# Patient Record
Sex: Female | Born: 1980 | Race: White | Hispanic: No | Marital: Married | Smoking: Former smoker
Health system: Southern US, Community
[De-identification: ages and names within clinical notes are randomized; demographics above are authoritative.]

## PROBLEM LIST (undated history)

## (undated) DIAGNOSIS — E669 Obesity, unspecified: Secondary | ICD-10-CM

## (undated) DIAGNOSIS — F419 Anxiety disorder, unspecified: Secondary | ICD-10-CM

## (undated) DIAGNOSIS — N809 Endometriosis, unspecified: Secondary | ICD-10-CM

## (undated) DIAGNOSIS — F32A Depression, unspecified: Secondary | ICD-10-CM

## (undated) DIAGNOSIS — E079 Disorder of thyroid, unspecified: Secondary | ICD-10-CM

## (undated) DIAGNOSIS — R51 Headache: Secondary | ICD-10-CM

## (undated) DIAGNOSIS — R519 Headache, unspecified: Secondary | ICD-10-CM

## (undated) DIAGNOSIS — N979 Female infertility, unspecified: Secondary | ICD-10-CM

## (undated) DIAGNOSIS — E039 Hypothyroidism, unspecified: Secondary | ICD-10-CM

## (undated) DIAGNOSIS — K529 Noninfective gastroenteritis and colitis, unspecified: Secondary | ICD-10-CM

## (undated) DIAGNOSIS — F329 Major depressive disorder, single episode, unspecified: Secondary | ICD-10-CM

## (undated) HISTORY — DX: Female infertility, unspecified: N97.9

## (undated) HISTORY — DX: Endometriosis, unspecified: N80.9

## (undated) HISTORY — DX: Major depressive disorder, single episode, unspecified: F32.9

## (undated) HISTORY — DX: Depression, unspecified: F32.A

## (undated) HISTORY — PX: TONSILLECTOMY: SUR1361

## (undated) HISTORY — DX: Disorder of thyroid, unspecified: E07.9

## (undated) HISTORY — PX: OTHER SURGICAL HISTORY: SHX169

## (undated) HISTORY — PX: COLON SURGERY: SHX602

## (undated) HISTORY — DX: Headache: R51

## (undated) HISTORY — PX: MASS EXCISION: SHX2000

## (undated) HISTORY — DX: Obesity, unspecified: E66.9

## (undated) HISTORY — DX: Headache, unspecified: R51.9

## (undated) HISTORY — DX: Anxiety disorder, unspecified: F41.9

## (undated) HISTORY — DX: Noninfective gastroenteritis and colitis, unspecified: K52.9

---

## 2000-06-12 ENCOUNTER — Encounter: Admission: RE | Admit: 2000-06-12 | Discharge: 2000-06-12 | Payer: Self-pay

## 2001-12-26 ENCOUNTER — Other Ambulatory Visit: Admission: RE | Admit: 2001-12-26 | Discharge: 2001-12-26 | Payer: Self-pay | Admitting: Family Medicine

## 2008-03-11 ENCOUNTER — Ambulatory Visit: Payer: Self-pay | Admitting: Unknown Physician Specialty

## 2008-03-11 LAB — HM COLONOSCOPY

## 2008-08-17 ENCOUNTER — Ambulatory Visit: Payer: Self-pay | Admitting: Family

## 2008-08-17 ENCOUNTER — Inpatient Hospital Stay: Payer: Self-pay | Admitting: Unknown Physician Specialty

## 2010-03-30 ENCOUNTER — Ambulatory Visit: Payer: Self-pay | Admitting: Gastroenterology

## 2010-04-29 ENCOUNTER — Other Ambulatory Visit: Payer: Self-pay | Admitting: Family

## 2010-05-23 HISTORY — PX: LEFT COLECTOMY: SHX856

## 2010-11-27 DIAGNOSIS — E079 Disorder of thyroid, unspecified: Secondary | ICD-10-CM

## 2010-11-27 HISTORY — DX: Disorder of thyroid, unspecified: E07.9

## 2011-05-23 ENCOUNTER — Ambulatory Visit: Payer: Self-pay | Admitting: Gastroenterology

## 2011-10-26 ENCOUNTER — Ambulatory Visit: Payer: Self-pay

## 2012-11-28 ENCOUNTER — Ambulatory Visit: Payer: Self-pay | Admitting: Family Medicine

## 2013-09-17 ENCOUNTER — Ambulatory Visit: Payer: Self-pay | Admitting: Gastroenterology

## 2015-03-12 DIAGNOSIS — N800 Endometriosis of uterus: Secondary | ICD-10-CM

## 2015-03-12 DIAGNOSIS — N8003 Adenomyosis of the uterus: Secondary | ICD-10-CM

## 2015-03-12 HISTORY — DX: Adenomyosis of the uterus: N80.03

## 2015-03-12 HISTORY — DX: Endometriosis of uterus: N80.0

## 2015-05-07 ENCOUNTER — Other Ambulatory Visit: Payer: Self-pay | Admitting: Family Medicine

## 2015-05-11 ENCOUNTER — Other Ambulatory Visit: Payer: Self-pay | Admitting: Family Medicine

## 2015-05-18 ENCOUNTER — Telehealth: Payer: Self-pay | Admitting: Family Medicine

## 2015-05-18 NOTE — Telephone Encounter (Signed)
Pt has not been since 2014. She is going out of town. Please review. Thank you-aa

## 2015-05-18 NOTE — Telephone Encounter (Signed)
Pt contacted office for refill request on the following medications:  Levothyroxine Sodium 125MCG.  [place].  Pt states she is leaving tomorrow to go out of town and is requesting this sent to the pharmacy today if possible.  CB#[phone]/MJ

## 2015-05-18 NOTE — Telephone Encounter (Signed)
  She can have 15 pills. Doing that against my better judgement. Must make appt to be see.  Tawanna Sat would be most appropriate.

## 2015-05-18 NOTE — Telephone Encounter (Signed)
Prescription for # 15 pills was called to [facility] and they were asked to tell the patient she could not get any more without being seen.  She was a patient of Debbie's and she can make an appointment with Tawanna Sat

## 2015-06-22 DIAGNOSIS — E669 Obesity, unspecified: Secondary | ICD-10-CM | POA: Insufficient documentation

## 2015-06-22 DIAGNOSIS — N920 Excessive and frequent menstruation with regular cycle: Secondary | ICD-10-CM | POA: Insufficient documentation

## 2015-06-22 DIAGNOSIS — F419 Anxiety disorder, unspecified: Secondary | ICD-10-CM | POA: Insufficient documentation

## 2015-06-22 DIAGNOSIS — F329 Major depressive disorder, single episode, unspecified: Secondary | ICD-10-CM | POA: Insufficient documentation

## 2015-06-22 DIAGNOSIS — E039 Hypothyroidism, unspecified: Secondary | ICD-10-CM | POA: Insufficient documentation

## 2015-06-23 ENCOUNTER — Ambulatory Visit (INDEPENDENT_AMBULATORY_CARE_PROVIDER_SITE_OTHER): Payer: BLUE CROSS/BLUE SHIELD | Admitting: Family Medicine

## 2015-06-23 ENCOUNTER — Encounter: Payer: Self-pay | Admitting: Family Medicine

## 2015-06-23 VITALS — BP 128/64 | HR 84 | Temp 99.1°F | Resp 16 | Ht 66.0 in | Wt 260.0 lb

## 2015-06-23 DIAGNOSIS — E038 Other specified hypothyroidism: Secondary | ICD-10-CM | POA: Diagnosis not present

## 2015-06-23 DIAGNOSIS — E669 Obesity, unspecified: Secondary | ICD-10-CM

## 2015-06-23 DIAGNOSIS — F419 Anxiety disorder, unspecified: Secondary | ICD-10-CM | POA: Diagnosis not present

## 2015-06-23 MED ORDER — LEVOTHYROXINE SODIUM 125 MCG PO TABS: 125.0000 ug | ORAL_TABLET | Freq: Every day | ORAL | Status: DC

## 2015-06-23 NOTE — Progress Notes (Signed)
Subjective:  HPI   Hypothyroid, follow-up:  No results found for: TSH Wt Readings from Last 3 Encounters:  06/23/15 260 lb (117.935 kg)  11/25/13 254 lb (115.214 kg)    She was last seen for hypothyroid 12 months ago. Per patient can not find the actual OV note. Management changes since that visit include none per patient. She reports good compliance with treatment. She is not having side effects.  She is experiencing fatigue due to been off her medication for about 1 1/2 months She denies palpitations Weight trend: stable      Prior to Admission medications   Medication Sig Start Date End Date Taking? Authorizing Provider  ALPRAZolam Duanne Moron) 0.5 MG tablet Take by mouth. 11/25/13  Yes Historical Provider, MD  Multiple Vitamins-Minerals (MULTIVITAMIN ADULT PO) Take 1 tablet by mouth daily.   Yes Historical Provider, MD  levothyroxine (SYNTHROID, LEVOTHROID) 125 MCG tablet Take by mouth. 12/08/14   Historical Provider, MD    Patient Active Problem List   Diagnosis Date Noted  . Anxiety 06/22/2015  . 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 Adult hypothyroidism 06/22/2015  . Irregular bleeding 06/22/2015  . Adiposity 06/22/2015    No past medical history on file.  History   Social History  . Marital Status: Married    Spouse Name: Tige  . Number of Children: 0  . Years of Education: 16   Occupational History  . [employer]    Social History Main Topics  . Smoking status: Former Smoker -- 0.25 packs/day for 8 years    Types: Cigarettes  . Smokeless tobacco: Never Used  . Alcohol Use: No  . Drug Use: No  . Sexual Activity: Yes    Birth Control/ Protection: None   Other Topics Concern  . Not on file   Social History Narrative    Allergies  Allergen Reactions  . Cortisporin  [Neomycin-Polymyxin-Hc] Swelling    ROS  Immunization History  Administered Date(s) Administered  . Hepatitis B 01/08/1997, 06/16/1997  . Td 01/08/1997,  05/19/1999   Objective:  BP 128/64 mmHg  Pulse 84  Temp(Src) 99.1 F (37.3 C)  Resp 16  Ht 5\' 6"  (1.676 m)  Wt 260 lb (117.935 kg)  BMI 41.99 kg/m2  LMP 06/13/2015 (Exact Date)  Physical Exam  Constitutional: She is oriented to person, place, and time and well-developed, well-nourished, and in no distress.  HENT:  Head: Normocephalic.  Eyes: Conjunctivae are normal. Pupils are equal, round, and reactive to light.  Neck: Normal range of motion. Neck supple.  Cardiovascular: Normal rate, regular rhythm, normal heart sounds and intact distal pulses.   No murmur heard. Pulmonary/Chest: Effort normal and breath sounds normal. No respiratory distress. She has no wheezes.  Musculoskeletal: Normal range of motion. She exhibits no edema or tenderness.  Neurological: She is alert and oriented to person, place, and time. Gait normal.  Psychiatric: Mood, memory, affect and judgment normal.    No results found for: WBC, HGB, HCT, PLT, GLUCOSE, CHOL, TRIG, HDL, LDLDIRECT, LDLCALC, TSH, PSA, INR, GLUF, HGBA1C, MICROALBUR  CMP  No results found for: NA, K, CL, CO2, GLUCOSE, BUN, CREATININE, CALCIUM, PROT, ALBUMIN, AST, ALT, ALKPHOS, BILITOT, GFRNONAA, GFRAA  Assessment and Plan :  1. Other specified hypothyroidism Patient has been off the medication for about 1 month. Will check levels today and refill medication, advised patient levels will be off but will continue at this time.  - TSH  2. Adiposity   3. Anxiety Stable per  patient     Miguel Aschoff MD [facility] 06/23/2015 8:42 AM

## 2015-06-24 LAB — TSH: TSH: 2.72 u[IU]/mL (ref 0.450–4.500)

## 2015-06-29 ENCOUNTER — Ambulatory Visit: Payer: Self-pay | Admitting: Family Medicine

## 2015-11-18 ENCOUNTER — Encounter: Payer: BLUE CROSS/BLUE SHIELD | Admitting: Family Medicine

## 2016-06-28 ENCOUNTER — Other Ambulatory Visit: Payer: Self-pay | Admitting: Family Medicine

## 2016-06-28 DIAGNOSIS — E038 Other specified hypothyroidism: Secondary | ICD-10-CM

## 2017-01-02 ENCOUNTER — Ambulatory Visit (INDEPENDENT_AMBULATORY_CARE_PROVIDER_SITE_OTHER): Payer: BLUE CROSS/BLUE SHIELD | Admitting: Family Medicine

## 2017-01-02 VITALS — BP 112/74 | HR 74 | Temp 98.0°F | Resp 18 | Wt 253.0 lb

## 2017-01-02 DIAGNOSIS — R52 Pain, unspecified: Secondary | ICD-10-CM | POA: Diagnosis not present

## 2017-01-02 DIAGNOSIS — R509 Fever, unspecified: Secondary | ICD-10-CM

## 2017-01-02 DIAGNOSIS — J320 Chronic maxillary sinusitis: Secondary | ICD-10-CM | POA: Diagnosis not present

## 2017-01-02 DIAGNOSIS — J029 Acute pharyngitis, unspecified: Secondary | ICD-10-CM | POA: Diagnosis not present

## 2017-01-02 LAB — POCT INFLUENZA A/B
INFLUENZA A, POC: NEGATIVE
INFLUENZA B, POC: NEGATIVE

## 2017-01-02 LAB — POCT RAPID STREP A (OFFICE): RAPID STREP A SCREEN: NEGATIVE

## 2017-01-02 MED ORDER — AMOXICILLIN-POT CLAVULANATE 875-125 MG PO TABS: 1.0000 | ORAL_TABLET | Freq: Two times a day (BID) | ORAL | 0 refills | Status: DC

## 2017-01-02 NOTE — Progress Notes (Signed)
Jodi Carpenter  MRN: [id number] DOB: 1981/09/25  Subjective:  HPI   The patient is a 36 year old female who presents with cold symptoms.  They began 5 days ago.  She has had very sore throat, productive cough of yellow/green sputum, right ear stopped up, low grade fever and some body aches.  She states that the throat and ear are the worst of the symptoms.  She has used Tylenol and some NyQuil.    Patient Active Problem List   Diagnosis Date Noted  . Anxiety 06/22/2015  . 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 Adult hypothyroidism 06/22/2015  . Irregular bleeding 06/22/2015  . Adiposity 06/22/2015    No past medical history on file.  Social History   Social History  . Marital status: Married    Spouse name: Jodi Carpenter  . Number of children: 0  . Years of education: 48   Occupational History  . [employer]    Social History Main Topics  . Smoking status: Former Smoker    Packs/day: 0.25    Years: 8.00    Types: Cigarettes  . Smokeless tobacco: Never Used  . Alcohol use No  . Drug use: No  . Sexual activity: Yes    Birth control/ protection: None   Other Topics Concern  . Not on file   Social History Narrative  . No narrative on file    Outpatient Encounter Prescriptions as of 01/02/2017  Medication Sig Note  . ALPRAZolam (XANAX) 0.5 MG tablet Take by mouth. 06/22/2015: Medication taken as needed.  Received from: [facility]  . levothyroxine (SYNTHROID, LEVOTHROID) 125 MCG tablet TAKE 1 TABLET EVERY DAY   . Multiple Vitamins-Minerals (MULTIVITAMIN ADULT PO) Take 1 tablet by mouth daily.    No facility-administered encounter medications on file as of 01/02/2017.     Allergies  Allergen Reactions  . Cortisporin  [Neomycin-Polymyxin-Hc] Swelling    Review of Systems  Constitutional: Positive for chills, fever and malaise/fatigue.  HENT: Positive for congestion, ear pain (fullness), sinus pain and sore throat. Negative for ear  discharge, nosebleeds and tinnitus.   Respiratory: Positive for cough, sputum production and wheezing. Negative for hemoptysis, shortness of breath and stridor.   Cardiovascular: Negative for chest pain, palpitations and leg swelling.  Gastrointestinal: Negative for abdominal pain, blood in stool, constipation, diarrhea, nausea and vomiting.  Neurological: Positive for weakness. Negative for dizziness and headaches.    Objective:  BP 112/74 (BP Location: Right Arm, Patient Position: Sitting, Cuff Size: Large)   Pulse 74   Temp 98 F (36.7 C) (Oral)   Resp 18   Wt 253 lb (114.8 kg)   SpO2 98%   BMI 40.84 kg/m   Physical Exam  Constitutional: She is well-developed, well-nourished, and in no distress.  HENT:  Head: Normocephalic and atraumatic.  Mouth/Throat: No oropharyngeal exudate.  Eyes: Conjunctivae are normal. Pupils are equal, round, and reactive to light.  Neck: Normal range of motion. Neck supple.  Cardiovascular: Normal rate, regular rhythm and normal heart sounds.   Pulmonary/Chest: Effort normal and breath sounds normal.    Assessment and Plan :    1. Fever, unspecified fever cause  - POCT Influenza A/B - POCT rapid strep A - amoxicillin-clavulanate (AUGMENTIN) 875-125 MG tablet; Take 1 tablet by mouth 2 (two) times daily.  Dispense: 20 tablet; Refill: 0  2. Sore throat  - POCT Influenza A/B--negative - POCT rapid strep A--negative - amoxicillin-clavulanate (AUGMENTIN) 875-125 MG tablet; Take 1 tablet by mouth  2 (two) times daily.  Dispense: 20 tablet; Refill: 0  3. Body aches  - POCT Influenza A/B - POCT rapid strep A  4. Maxillary sinusitis, unspecified chronicity  - amoxicillin-clavulanate (AUGMENTIN) 875-125 MG tablet; Take 1 tablet by mouth 2 (two) times daily.  Dispense: 20 tablet; Refill: 0   HPI, Exam and A&P Transcribed under the direction and in the presence of Wilhemena Durie., MD. Electronically Signed: Althea Charon, RMA I have done the  exam and reviewed the chart and it is accurate to the best of my knowledge. Development worker, community has been used and  any errors in dictation or transcription are unintentional. Miguel Aschoff M.D. [facility]

## 2017-04-24 ENCOUNTER — Other Ambulatory Visit: Payer: Self-pay | Admitting: Family Medicine

## 2017-04-24 ENCOUNTER — Ambulatory Visit (INDEPENDENT_AMBULATORY_CARE_PROVIDER_SITE_OTHER): Payer: BLUE CROSS/BLUE SHIELD | Admitting: Certified Nurse Midwife

## 2017-04-24 ENCOUNTER — Encounter: Payer: Self-pay | Admitting: Certified Nurse Midwife

## 2017-04-24 VITALS — BP 108/68 | HR 77 | Ht 65.0 in | Wt 240.0 lb

## 2017-04-24 DIAGNOSIS — N920 Excessive and frequent menstruation with regular cycle: Secondary | ICD-10-CM

## 2017-04-24 DIAGNOSIS — Z124 Encounter for screening for malignant neoplasm of cervix: Secondary | ICD-10-CM | POA: Diagnosis not present

## 2017-04-24 DIAGNOSIS — E038 Other specified hypothyroidism: Secondary | ICD-10-CM

## 2017-04-24 DIAGNOSIS — E063 Autoimmune thyroiditis: Secondary | ICD-10-CM | POA: Insufficient documentation

## 2017-04-24 DIAGNOSIS — Z01419 Encounter for gynecological examination (general) (routine) without abnormal findings: Secondary | ICD-10-CM

## 2017-04-24 DIAGNOSIS — K5190 Ulcerative colitis, unspecified, without complications: Secondary | ICD-10-CM | POA: Insufficient documentation

## 2017-04-24 LAB — HM PAP SMEAR

## 2017-04-24 MED ORDER — TRANEXAMIC ACID 650 MG PO TABS: 1300.0000 mg | ORAL_TABLET | Freq: Three times a day (TID) | ORAL | 2 refills | Status: DC

## 2017-04-24 NOTE — Progress Notes (Signed)
Gynecology Annual Exam  PCP: Jerrol Banana., MD  Chief Complaint:  Chief Complaint  Patient presents with  . Gynecologic Exam    History of Present Illness:Jodi Carpenter presents today for her annual exam. She is a 36 year old Caucasian/White female, G0 P0000, whose LMP was 03/26/2017. She is having problems with menorrhagia. Her menses are regular. They occur every month, they last 6-7 days, are heavy flow x 4-5 days requiring pad and tampon change every hour or so. She has dysmenorrhea with the start of the menses and it lasts for 4-5 days. She takes Tylenol but it is not usually effective.  The patient's past medical history is remarkable for ulcerative colitis, Hashimoto's thyroiditis/ hypothyroidism, obesity, and infertility. She has had an extensive infertility work up. She is currently separated from her husband and is not wishing to conceive at this time.. Since her last annual GYN exam 02/16/2015,  she has lost 14#.  She is sexually active and reports no problems with intercourse. She is currently uses nothing for contraception. Her last ultrasound in 2016 was c/w adenomyosis  Her most recent Pap smear was 02/16/2015 and was NIL/neg HRHPV . No history of abnormal Pap smears  Mammogram is not applicable. There is no family history of breast cancer The patient does not do monthly self breast exams.  The patient does not smoke The patient does drink 1-2 drinks/month. The patient does not use drugs The patient does exercise regularly. She lifts weights, has trainer, runs, and walks 3-5 times a week.  Patient reports no symptoms of concern as it pertains to depression or anxiety.       Review of Systems: Review of Systems  Constitutional: Negative for chills, fever and weight loss.  HENT: Negative for congestion, sinus pain and sore throat.   Eyes: Negative for blurred vision and pain.  Respiratory: Negative for hemoptysis, shortness of breath and wheezing.     Cardiovascular: Negative for chest pain, palpitations and leg swelling.  Gastrointestinal: Positive for diarrhea (after sigmoid colectomy). Negative for abdominal pain, blood in stool, heartburn, nausea and vomiting.  Genitourinary: Negative for dysuria, frequency, hematuria and urgency.       Positive for menorrhagia and dysmenorrhea  Musculoskeletal: Negative for back pain, joint pain and myalgias.  Skin: Negative for itching and rash.  Neurological: Negative for dizziness, tingling and headaches.  Endo/Heme/Allergies: Negative for environmental allergies and polydipsia. Does not bruise/bleed easily.       Negative for hirsutism   Psychiatric/Behavioral: Negative for depression. The patient is not nervous/anxious and does not have insomnia.     Past Medical History:  Past Medical History:  Diagnosis Date  . Adenomyosis 03/12/2015  . Anxiety   . Colitis   . Depression   . Headache   . Infertility, female   . Obesity (BMI 35.0-39.9 without comorbidity)   . Thyroid disease 2012   Hashimoto's thyroiditis/ hypothyroidism    Past Surgical History:  Past Surgical History:  Procedure Laterality Date  . LEFT COLECTOMY     sigmoid colectomy with J pouch  . MASS EXCISION     pilonidal cyst and abscess  . OTHER SURGICAL HISTORY     illeionidal anal stenosis   . TONSILLECTOMY      Family History:  Family History  Problem Relation Age of Onset  . Hypertension Mother   . Panic disorder Mother   . Hypertension Father   . Diverticulosis Father   . Prostate cancer Maternal  Grandfather     Social History:  Social History   Social History  . Marital status: Legally Separated    Spouse name: Tige  . Number of children: 0  . Years of education: 17   Occupational History  . [employer]    Social History Main Topics  . Smoking status: Former Smoker    Packs/day: 0.25    Years: 8.00    Types: Cigarettes  . Smokeless tobacco: Never Used  . Alcohol use Yes      Comment: 1-2 a month  . Drug use: No  . Sexual activity: Yes    Birth control/ protection: None   Other Topics Concern  . Not on file   Social History Narrative  . No narrative on file    Allergies:  Allergies  Allergen Reactions  . Cortisporin  [Neomycin-Polymyxin-Hc] Swelling    Medications: Prior to Admission medications   Medication Sig Start Date End Date Taking? Authorizing Provider  ALPRAZolam Duanne Moron) 0.5 MG tablet Take by mouth. 11/25/13  Yes [provider]  levothyroxine (SYNTHROID, LEVOTHROID) 125 MCG tablet TAKE 1 TABLET EVERY DAY 06/28/16  Yes Jerrol Banana., MD  Multiple Vitamins-Minerals (MULTIVITAMIN ADULT PO) Take 1 tablet by mouth daily.   Yes [provider]           Physical Exam Vitals: Blood pressure 108/68, pulse 77, height 5\' 5"  (1.651 m), weight 240 lb (108.9 kg), BMI 39.94 kg/m2, last menstrual period 03/26/2017.  General: pleasant WF in NAD HEENT: normocephalic, anicteric Neck: no thyroid enlargement, no palpable nodules, no cervical lymphadenopathy  Pulmonary: No increased work of breathing, CTAB Cardiovascular: RRR, without murmur  Breast: Breast symmetrical, no tenderness, no palpable nodules or masses, no skin or nipple retraction present, no nipple discharge.  No axillary, infraclavicular or supraclavicular lymphadenopathy. Abdomen: Soft, non-tender, non-distended.  Umbilicus without lesions.  No hepatomegaly or masses palpable. No evidence of hernia. Genitourinary:  External: Normal external female genitalia.  Normal urethral meatus, normal  Bartholin's and Skene's glands.    Vagina: Normal vaginal mucosa, no evidence of prolapse.    Cervix: Grossly normal in appearance, no bleeding, non-tender  Uterus: Anteverted, globular, 8 week size, mobile, and non-tender  Adnexa: No adnexal masses, non-tender  Rectal: deferred  Lymphatic: no evidence of inguinal lymphadenopathy Extremities: no edema, erythema, or  tenderness Neurologic: Grossly intact Psychiatric: mood appropriate, affect full     Assessment: 36 y.o. G0P0000 well woman exam Menorrhagia/ suspected adenomyosis Primary infertility  Plan:    1) Breast cancer screening - recommend monthly self breast exam. Recommend starting annual mammograms at age 13 2) Cervical cancer screening - Pap was done. ASCCP guidelines and rational discussed.  Patient opts for yearly screening interval 3) Contraception - history of infertility, not using any contraception. Recommend remaining on multivitamin 4) Routine healthcare maintenance including cholesterol and diabetes screening managed by PCP. Needs appointment soon. Recommend also getting CBC. 5) Discussed treatments for menorrhagia including Lysteda, hormones (pills, depo, etc), Mirena IUD, ablation, hysterectomy. Patient desires to try Lysteda. Rx for Lysteda 650 mgm-take two tabs tid during heavy flow days for no more than 5 days. #30/RFx2 6) FU in 1 year for annual or sooner prn.  Dalia Heading, CNM

## 2017-04-27 LAB — IGP, APTIMA HPV
HPV Aptima: NEGATIVE
PAP SMEAR COMMENT: 0

## 2017-05-08 ENCOUNTER — Ambulatory Visit (INDEPENDENT_AMBULATORY_CARE_PROVIDER_SITE_OTHER): Payer: BLUE CROSS/BLUE SHIELD | Admitting: Family Medicine

## 2017-05-08 ENCOUNTER — Encounter: Payer: Self-pay | Admitting: Family Medicine

## 2017-05-08 DIAGNOSIS — E038 Other specified hypothyroidism: Secondary | ICD-10-CM

## 2017-05-08 DIAGNOSIS — Z0000 Encounter for general adult medical examination without abnormal findings: Secondary | ICD-10-CM

## 2017-05-08 MED ORDER — LEVOTHYROXINE SODIUM 125 MCG PO TABS: 125.0000 ug | ORAL_TABLET | Freq: Every day | ORAL | 3 refills | Status: DC

## 2017-05-08 NOTE — Progress Notes (Signed)
Patient: Jodi Carpenter, Female    DOB: [birth year], 36 y.o.   MRN: [id number] Visit Date: 05/08/2017  Today's Provider: Wilhemena Durie, MD   Chief Complaint  Patient presents with  . Annual Exam   Subjective:    Annual physical exam Jodi Carpenter is a 36 y.o. female who presents today for health maintenance and complete physical. She feels well, other than being tired from not having her thyroid medication. She reports exercising 3-5 times a week. She reports she is sleeping well. Gyn at [facility]. Presently going through divorce,no children.  ----------------------------------------------------------------- She gets her pap through GYN and has had that done this year already. She is mainly wanting labs. Vaccines UTD.  Review of Systems  Constitutional: Positive for fatigue.  HENT: Negative.   Eyes: Negative.   Respiratory: Negative.   Cardiovascular: Negative.   Gastrointestinal: Negative.   Endocrine: Negative.   Genitourinary: Negative.   Musculoskeletal: Negative.   Skin: Negative.   Allergic/Immunologic: Negative.   Neurological: Negative.   Hematological: Negative.   Psychiatric/Behavioral: Negative.     Social History      She  reports that she has quit smoking. Her smoking use included Cigarettes. She has a 2.00 pack-year smoking history. She has never used smokeless tobacco. She reports that she drinks alcohol. She reports that she does not use drugs.       Social History   Social History  . Marital status: Legally Separated    Spouse name: Tige  . Number of children: 0  . Years of education: 18   Occupational History  . [employer]    Social History Main Topics  . Smoking status: Former Smoker    Packs/day: 0.25    Years: 8.00    Types: Cigarettes  . Smokeless tobacco: Never Used  . Alcohol use Yes     Comment: 1-2 a month  . Drug use: No  . Sexual activity: Yes    Birth control/ protection: None   Other  Topics Concern  . None   Social History Narrative  . None    Past Medical History:  Diagnosis Date  . Adenomyosis 03/12/2015  . Anxiety   . Colitis   . Depression   . Headache   . Infertility, female   . Obesity (BMI 35.0-39.9 without comorbidity)   . Thyroid disease 2012   Hashimoto's thyroiditis/ hypothyroidism     Patient Active Problem List   Diagnosis Date Noted  . Ulcerative colitis ([place]) 04/24/2017  . Hashimoto's thyroiditis 04/24/2017  . Anxiety 06/22/2015  . 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 Adult hypothyroidism 06/22/2015  . Menorrhagia with regular cycle 06/22/2015  . Adiposity 06/22/2015    Past Surgical History:  Procedure Laterality Date  . LEFT COLECTOMY     sigmoid colectomy with J pouch  . MASS EXCISION     pilonidal cyst and abscess  . OTHER SURGICAL HISTORY     illeionidal anal stenosis   . TONSILLECTOMY      Family History        Family Status  Relation Status  . Mother Alive  . Father Alive  . Sister Alive  . Sister Alive  . MGF (Not Specified)        Her family history includes Diverticulosis in her father; Hypertension in her father and mother; Panic disorder in her mother; Prostate cancer in her maternal grandfather.     Allergies  Allergen Reactions  . Cortisporin  [Neomycin-Polymyxin-Hc]  Swelling     Current Outpatient Prescriptions:  .  ALPRAZolam (XANAX) 0.5 MG tablet, Take by mouth., Disp: , Rfl:  .  levothyroxine (SYNTHROID, LEVOTHROID) 125 MCG tablet, TAKE 1 TABLET EVERY DAY ON AN EMPTY STOMACH WITH A GLASS OF WATER AT LEAST 30-60 MINUTES BEFORE BREAKFAST, Disp: 30 tablet, Rfl: 1 .  Multiple Vitamins-Minerals (MULTIVITAMIN ADULT PO), Take 1 tablet by mouth daily., Disp: , Rfl:  .  tranexamic acid (LYSTEDA) 650 MG TABS tablet, Take 2 tablets (1,300 mg total) by mouth 3 (three) times daily. Take during menses for a maximum of five days, Disp: 30 tablet, Rfl: 2   Patient Care Team: Jerrol Banana., MD as PCP  - General (Family Medicine)      Objective:   Vitals: There were no vitals taken for this visit.  There were no vitals filed for this visit.   Physical Exam  Constitutional: She is oriented to person, place, and time. She appears well-developed and well-nourished.  HENT:  Head: Normocephalic and atraumatic.  Right Ear: External ear normal.  Left Ear: External ear normal.  Nose: Nose normal.  Mouth/Throat: Oropharynx is clear and moist.  Eyes: Conjunctivae are normal. No scleral icterus.  Neck: Neck supple. No thyromegaly present.  Cardiovascular: Normal rate, regular rhythm and normal heart sounds.   Pulmonary/Chest: Effort normal and breath sounds normal.  Abdominal: Soft.  Musculoskeletal: She exhibits no edema.  Neurological: She is alert and oriented to person, place, and time. She exhibits normal muscle tone. Coordination normal.  Skin: Skin is warm and dry.  Psychiatric: She has a normal mood and affect. Her behavior is normal. Judgment and thought content normal.     Depression Screen No flowsheet data found.    Assessment & Plan:     Routine Health Maintenance and Physical Exam  Exercise Activities and Dietary recommendations Goals    None      Immunization History  Administered Date(s) Administered  . Hepatitis B 01/08/1997, 06/16/1997  . Td 01/08/1997, 05/19/1999    Health Maintenance  Topic Date Due  . HIV Screening  12/02/1995  . TETANUS/TDAP  05/18/2009  . INFLUENZA VACCINE  06/27/2017  . PAP SMEAR  04/24/2020     Discussed health benefits of physical activity, and encouraged her to engage in regular exercise appropriate for her age and condition.  Hypothyroid No synthroid since April. Infirtility Obesity H/o Ulcerative Colitis S/p left colectomy   --------------------------------------------------------------------   I have done the exam and reviewed the above chart and it is accurate to the best of my knowledge. Development worker, community  has been used in this note in any air is in the dictation or transcription are unintentional.  Wilhemena Durie, MD  [place]

## 2017-07-12 ENCOUNTER — Ambulatory Visit (INDEPENDENT_AMBULATORY_CARE_PROVIDER_SITE_OTHER): Payer: BLUE CROSS/BLUE SHIELD | Admitting: Family Medicine

## 2017-07-12 VITALS — BP 108/76 | HR 68 | Temp 99.2°F | Resp 14 | Wt 240.0 lb

## 2017-07-12 DIAGNOSIS — F419 Anxiety disorder, unspecified: Secondary | ICD-10-CM

## 2017-07-12 DIAGNOSIS — L089 Local infection of the skin and subcutaneous tissue, unspecified: Secondary | ICD-10-CM | POA: Diagnosis not present

## 2017-07-12 DIAGNOSIS — E039 Hypothyroidism, unspecified: Secondary | ICD-10-CM

## 2017-07-12 MED ORDER — AMOXICILLIN-POT CLAVULANATE 875-125 MG PO TABS: 1.0000 | ORAL_TABLET | Freq: Two times a day (BID) | ORAL | 1 refills | Status: DC

## 2017-07-12 NOTE — Progress Notes (Signed)
Jodi Carpenter  MRN: [id number] DOB: 12-19-1980  Subjective:  HPI  Patient developed left pinky finger swelling, redness and pain about 1 week ago. No injury to the finger that she knows off. She has not had any fever, chills or drainage. She had to have her well water serviced and has had some break out on her face and wonders if that is the cause to the finger issue. Patient Active Problem List   Diagnosis Date Noted  . Ulcerative colitis ([place]) 04/24/2017  . Hashimoto's thyroiditis 04/24/2017  . Anxiety 06/22/2015  . 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 Adult hypothyroidism 06/22/2015  . Menorrhagia with regular cycle 06/22/2015  . Adiposity 06/22/2015    Past Medical History:  Diagnosis Date  . Adenomyosis 03/12/2015  . Anxiety   . Colitis   . Depression   . Headache   . Infertility, female   . Obesity (BMI 35.0-39.9 without comorbidity)   . Thyroid disease 2012   Hashimoto's thyroiditis/ hypothyroidism    Social History   Social History  . Marital status: Legally Separated    Spouse name: Tige  . Number of children: 0  . Years of education: 60   Occupational History  . [employer]    Social History Main Topics  . Smoking status: Former Smoker    Packs/day: 0.25    Years: 8.00    Types: Cigarettes  . Smokeless tobacco: Never Used  . Alcohol use Yes     Comment: 1-2 a month  . Drug use: No  . Sexual activity: Yes    Birth control/ protection: None   Other Topics Concern  . Not on file   Social History Narrative  . No narrative on file    Outpatient Encounter Prescriptions as of 07/12/2017  Medication Sig Note  . ALPRAZolam (XANAX) 0.5 MG tablet Take by mouth. 06/22/2015: Medication taken as needed.  Received from: [facility]  . levothyroxine (SYNTHROID, LEVOTHROID) 125 MCG tablet Take 1 tablet (125 mcg total) by mouth daily before breakfast.   . Multiple Vitamins-Minerals (MULTIVITAMIN ADULT PO) Take 1 tablet by  mouth daily.   . [DISCONTINUED] tranexamic acid (LYSTEDA) 650 MG TABS tablet Take 2 tablets (1,300 mg total) by mouth 3 (three) times daily. Take during menses for a maximum of five days    No facility-administered encounter medications on file as of 07/12/2017.     Allergies  Allergen Reactions  . Cortisporin  [Neomycin-Polymyxin-Hc] Swelling    Review of Systems  Constitutional: Negative.   Respiratory: Negative.   Cardiovascular: Negative.   Musculoskeletal: Positive for joint pain.  Skin:       Redness and swelling in the left pinky    Objective:  BP 108/76   Pulse 68   Temp 99.2 F (37.3 C)   Resp 14   Wt 240 lb (108.9 kg)   BMI 39.94 kg/m   Physical Exam  Constitutional: She is oriented to person, place, and time and well-developed, well-nourished, and in no distress.  Eyes: Pupils are equal, round, and reactive to light. Conjunctivae are normal.  Musculoskeletal:  Distal medial left finger is swollen and tender.  Neurological: She is alert and oriented to person, place, and time.    Assessment and Plan :  1. Infected finger/Early paranychium Does not look like an abscess at this time. Will treat with Augmentin. If symptoms get worse will see patient back and try to drain the finger at that time if need to. Follow as  needed.  2. Anxiety  3. Adult hypothyroidism  HPI, Exam and A&P transcribed by Theressa Millard, RMA under direction and in the presence of Miguel Aschoff, MD. I have done the exam and reviewed the chart and it is accurate to the best of my knowledge. Development worker, community has been used and  any errors in dictation or transcription are unintentional. Miguel Aschoff M.D. [facility]

## 2017-07-14 ENCOUNTER — Ambulatory Visit: Payer: BLUE CROSS/BLUE SHIELD | Admitting: Family Medicine

## 2017-07-17 ENCOUNTER — Ambulatory Visit (INDEPENDENT_AMBULATORY_CARE_PROVIDER_SITE_OTHER): Payer: BLUE CROSS/BLUE SHIELD | Admitting: Maternal Newborn

## 2017-07-17 ENCOUNTER — Encounter: Payer: Self-pay | Admitting: Maternal Newborn

## 2017-07-17 VITALS — BP 120/80 | HR 72 | Ht 65.0 in | Wt 232.0 lb

## 2017-07-17 DIAGNOSIS — Z3009 Encounter for other general counseling and advice on contraception: Secondary | ICD-10-CM

## 2017-07-17 NOTE — Progress Notes (Signed)
Obstetrics & Gynecology Office Visit   Chief Complaint/HPI: Patient desires to discuss options for contraception. Her menses are regular, every 28 days, lasting 3 days, with heavy flow and severe dysmenorrhea.  Past Medical History:  Past Medical History:  Diagnosis Date  . Adenomyosis 03/12/2015  . Anxiety   . Colitis   . Depression   . Headache   . Infertility, female   . Obesity (BMI 35.0-39.9 without comorbidity)   . Thyroid disease 2012   Hashimoto's thyroiditis/ hypothyroidism    Past Surgical History:  Past Surgical History:  Procedure Laterality Date  . LEFT COLECTOMY     sigmoid colectomy with J pouch  . MASS EXCISION     pilonidal cyst and abscess  . OTHER SURGICAL HISTORY     illeionidal anal stenosis   . TONSILLECTOMY      Gynecologic History: Patient's last menstrual period was 07/17/2017.  Obstetric History: G0P0000  Family History:  Family History  Problem Relation Age of Onset  . Hypertension Mother   . Panic disorder Mother   . Hypertension Father   . Diverticulosis Father   . Prostate cancer Maternal Grandfather     Social History:  Social History   Social History  . Marital status: Legally Separated    Spouse name: Tige  . Number of children: 0  . Years of education: 43   Occupational History  . [employer]    Social History Main Topics  . Smoking status: Former Smoker    Packs/day: 0.25    Years: 8.00    Types: Cigarettes  . Smokeless tobacco: Never Used  . Alcohol use Yes     Comment: 1-2 a month  . Drug use: No  . Sexual activity: Yes    Birth control/ protection: None   Other Topics Concern  . Not on file   Social History Narrative  . No narrative on file    Allergies:  Allergies  Allergen Reactions  . Cortisporin  [Neomycin-Polymyxin-Hc] Swelling    Medications: Prior to Admission medications   Medication Sig Start Date End Date Taking? Authorizing Provider  ALPRAZolam Duanne Moron) 0.5 MG tablet  Take by mouth. 11/25/13   [provider]  amoxicillin-clavulanate (AUGMENTIN) 875-125 MG tablet Take 1 tablet by mouth 2 (two) times daily. 07/12/17   Jerrol Banana., MD  levothyroxine Wilmer Floor, LEVOTHROID) 125 MCG tablet Take 1 tablet (125 mcg total) by mouth daily before breakfast. 05/08/17   Jerrol Banana., MD  Multiple Vitamins-Minerals (MULTIVITAMIN ADULT PO) Take 1 tablet by mouth daily.    [provider]    ROS As listed in HPI above.  Physical Exam Vitals:  Vitals:   07/17/17 1123  BP: 120/80  Pulse: 72   Patient's last menstrual period was 07/17/2017.  General: NAD Neurologic: Grossly intact Psychiatric: mood appropriate, affect full   Assessment: 36 y.o. G0P0000 with heavy and painful periods desires long acting contraception.  Plan: Problem List Items Addressed This Visit    None    Visit Diagnoses    Encounter for other general counseling or advice on contraception    -  Primary     We reviewed birth control options including: hormonal contraceptive medication (pill, injection), contraceptive implants and IUDs. Risks and benefits reviewed.  Questions were answered.  Information was given to patient to review.   She is currently considering a Mirena/Kyleena IUD or Nexplanon.  She will call for an appointment for device placement when she has  made a decision.  A total of 15 minutes were spent in face-to-face contact with the patient during this encounter with over half of that time devoted to counseling and coordination of care.  Avel Sensor, CNM

## 2017-10-16 ENCOUNTER — Ambulatory Visit: Payer: BLUE CROSS/BLUE SHIELD | Admitting: Family Medicine

## 2017-10-16 VITALS — BP 116/62 | HR 88 | Temp 98.7°F | Resp 16 | Wt 228.0 lb

## 2017-10-16 DIAGNOSIS — M778 Other enthesopathies, not elsewhere classified: Secondary | ICD-10-CM | POA: Diagnosis not present

## 2017-10-16 MED ORDER — PREDNISONE 10 MG (21) PO TBPK: ORAL_TABLET | ORAL | 0 refills | Status: DC

## 2017-10-16 NOTE — Progress Notes (Signed)
Patient: Jodi Carpenter Female    DOB: [birth year]   36 y.o.   MRN: [id number] Visit Date: 10/16/2017  Today's Provider: Wilhemena Durie, MD   Chief Complaint  Patient presents with  . Elbow Pain   Subjective:    HPI Pt reports that she has been having right elbow pain for about 2 months. She initially injured it working out. It got better but then about 2 weeks ago it flared up again and worse. She reports that it hurts to squeeze the tooth paste tube. She reports that it hurts all the time and feels swollen. She has tired ibuprofen but it is not touching it. She wants something for the pain and inflammation.     Allergies  Allergen Reactions  . Cortisporin  [Neomycin-Polymyxin-Hc] Swelling     Current Outpatient Medications:  .  ALPRAZolam (XANAX) 0.5 MG tablet, Take by mouth., Disp: , Rfl:  .  levothyroxine (SYNTHROID, LEVOTHROID) 125 MCG tablet, Take 1 tablet (125 mcg total) by mouth daily before breakfast., Disp: 90 tablet, Rfl: 3 .  Multiple Vitamins-Minerals (MULTIVITAMIN ADULT PO), Take 1 tablet by mouth daily., Disp: , Rfl:   Review of Systems  Constitutional: Negative.   HENT: Negative.   Eyes: Negative.   Respiratory: Negative.   Cardiovascular: Negative.   Gastrointestinal: Negative.   Endocrine: Negative.   Genitourinary: Negative.   Musculoskeletal: Positive for arthralgias and joint swelling.  Skin: Negative.   Allergic/Immunologic: Negative.   Neurological: Negative.   Hematological: Negative.   Psychiatric/Behavioral: Negative.     Social History   Tobacco Use  . Smoking status: Former Smoker    Packs/day: 0.25    Years: 8.00    Pack years: 2.00    Types: Cigarettes  . Smokeless tobacco: Never Used  Substance Use Topics  . Alcohol use: Yes    Comment: 1-2 a month   Objective:   BP 116/62 (BP Location: Left Arm, Patient Position: Sitting, Cuff Size: Large)   Pulse 88   Temp 98.7 F (37.1 C) (Oral)   Resp 16   Wt 228 lb  (103.4 kg)   BMI 37.94 kg/m  Vitals:   10/16/17 1116  BP: 116/62  Pulse: 88  Resp: 16  Temp: 98.7 F (37.1 C)  TempSrc: Oral  Weight: 228 lb (103.4 kg)     Physical Exam  Constitutional: She is oriented to person, place, and time. She appears well-developed and well-nourished.  HENT:  Head: Normocephalic and atraumatic.  Right Ear: External ear normal.  Left Ear: External ear normal.  Nose: Nose normal.  Mouth/Throat: Oropharynx is clear and moist.  Eyes: Conjunctivae and EOM are normal. Pupils are equal, round, and reactive to light. No scleral icterus.  Neck: Normal range of motion. Neck supple. No thyromegaly present.  Cardiovascular: Normal rate, regular rhythm, normal heart sounds and intact distal pulses.  Pulmonary/Chest: Effort normal and breath sounds normal.  Abdominal: Soft.  Musculoskeletal: She exhibits tenderness (tender over [place]).  Neurological: She is alert and oriented to person, place, and time. She has normal reflexes.  Skin: Skin is warm and dry.  Psychiatric: She has a normal mood and affect. Her behavior is normal. Judgment and thought content normal.        Assessment & Plan:     1. Tendonitis of elbow, right  - Ambulatory referral to Orthopedic Surgery - predniSONE (STERAPRED UNI-PAK 21 TAB) 10 MG (21) TBPK tablet; Take 6 the first day then decrease by  1 until finished.  Dispense: 21 tablet; Refill: 0      HPI, Exam, and A&P Transcribed under the direction and in the presence of Richard L. Cranford Mon, MD  Electronically Signed: Katina Dung, [place], MD  [facility]

## 2017-11-16 ENCOUNTER — Ambulatory Visit: Payer: BLUE CROSS/BLUE SHIELD | Admitting: Family Medicine

## 2017-11-16 ENCOUNTER — Encounter: Payer: Self-pay | Admitting: Family Medicine

## 2017-11-16 VITALS — BP 122/80 | HR 85 | Temp 98.8°F | Resp 16 | Wt 227.8 lb

## 2017-11-16 DIAGNOSIS — J0101 Acute recurrent maxillary sinusitis: Secondary | ICD-10-CM | POA: Diagnosis not present

## 2017-11-16 MED ORDER — LEVOFLOXACIN 500 MG PO TABS: 500.0000 mg | ORAL_TABLET | Freq: Every day | ORAL | 0 refills | Status: DC

## 2017-11-16 NOTE — Patient Instructions (Signed)
Discussed use of Mucinex D for congestion, Delsym for cough. If your right ear not improving start Flonase steroid nasal spray.

## 2017-11-16 NOTE — Progress Notes (Signed)
Subjective:     Patient ID: Jodi Carpenter, female   DOB: 14-Dec-1980, 36 y.o.   MRN: [id number] Chief Complaint  Patient presents with  . URI    Patient comes to office to follow up for URI, patient was seen at urgent care clinic on 11/03/17 and diagnosed with URI and double ear infection paiten as pescribed ear drops, Augmentin and Tessalon for cough. Patient states that she has two days left on medication and symptoms are not improved.    HPI States she experienced 5 days of cold sx prior to her Urgent Care visit. Patient reports persistent sinus pressure, purulent sinus drainage, and cough  Review of Systems     Objective:   Physical Exam  Constitutional: She appears well-developed and well-nourished. No distress.  Ears: T.M's intact without inflammation Sinuses: mild maxillary and frontal sinus tenderness Throat: tonsils absent Neck: no cervical adenopathy Lungs: clear     Assessment:    1. Acute recurrent maxillary sinusitis - levofloxacin (LEVAQUIN) 500 MG tablet; Take 1 tablet (500 mg total) by mouth daily.  Dispense: 7 tablet; Refill: 0    Plan:    Discussed use of Mucinex D, Flonase, and Delsym.

## 2017-11-30 ENCOUNTER — Telehealth: Payer: Self-pay

## 2017-11-30 ENCOUNTER — Ambulatory Visit: Payer: BLUE CROSS/BLUE SHIELD | Admitting: Physician Assistant

## 2017-11-30 DIAGNOSIS — J111 Influenza due to unidentified influenza virus with other respiratory manifestations: Secondary | ICD-10-CM

## 2017-11-30 MED ORDER — OSELTAMIVIR PHOSPHATE 75 MG PO CAPS: 75.0000 mg | ORAL_CAPSULE | Freq: Two times a day (BID) | ORAL | 0 refills | Status: DC

## 2017-11-30 NOTE — Telephone Encounter (Signed)
Patient called and states that her boyfriend was diagnosed with Flu this morning and was treated with Tamiflu. She does not know if it was A or B. Patient is having sore throat and low grade fever at this time and wanted to see if she should be treated since he was positive. Please advise. Patient of dr Rosanna Randy. CB: is [phone], uses [employer], RMA

## 2017-11-30 NOTE — Telephone Encounter (Signed)
Sent in

## 2017-11-30 NOTE — Telephone Encounter (Signed)
Pt informed and voiced understanding of results. 

## 2017-12-10 ENCOUNTER — Ambulatory Visit: Payer: BLUE CROSS/BLUE SHIELD | Admitting: Physician Assistant

## 2017-12-10 DIAGNOSIS — R062 Wheezing: Secondary | ICD-10-CM | POA: Diagnosis not present

## 2017-12-10 DIAGNOSIS — R6889 Other general symptoms and signs: Secondary | ICD-10-CM

## 2017-12-10 DIAGNOSIS — R05 Cough: Secondary | ICD-10-CM

## 2017-12-10 DIAGNOSIS — R059 Cough, unspecified: Secondary | ICD-10-CM

## 2017-12-10 MED ORDER — HYDROCODONE-HOMATROPINE 5-1.5 MG/5ML PO SYRP: 5.0000 mL | ORAL_SOLUTION | Freq: Every evening | ORAL | 0 refills | Status: DC | PRN

## 2017-12-10 MED ORDER — PREDNISONE 10 MG PO TABS: 10.0000 mg | ORAL_TABLET | Freq: Every day | ORAL | 0 refills | Status: DC

## 2017-12-10 MED ORDER — PREDNISONE 10 MG PO TABS: 10.0000 mg | ORAL_TABLET | Freq: Two times a day (BID) | ORAL | 0 refills | Status: AC

## 2017-12-10 NOTE — Patient Instructions (Signed)

## 2017-12-10 NOTE — Progress Notes (Signed)
[place]  Chief Complaint  Patient presents with  . URI    Subjective:    Patient ID: Jodi Carpenter, female    DOB: [birth year], 37 y.o.   MRN: [id number]  Upper Respiratory Infection: Jodi Carpenter is a 37 y.o. female with a past medical history significant for ulcerative colitis, complaining of symptoms of a URI, possible sinusitis. Symptoms include congestion, cough, sore throat and swollen glands. Onset of symptoms was 9 days ago, gradually worsening since that time. She says her boyfriend was diagnosed with the flu. Soon after, she began feeling ill with fevers, fatigue, sore throat, and body aches. She called this clinic and was sent in Tamiflu. She says it's been about a week and she doesn't feel much better. She also c/o congestion and cough described as productive for the past 9 days .  She is drinking plenty of fluids. Evaluation to date: none. Treatment to date: Tamiflu. The treatment has provided no relief.  Review of Systems  Constitutional: Positive for fatigue. Negative for activity change, appetite change, chills, diaphoresis, fever and unexpected weight change.  HENT: Positive for congestion and sore throat. Negative for ear discharge, ear pain, nosebleeds, postnasal drip, rhinorrhea, sinus pressure, sinus pain, tinnitus and trouble swallowing.   Eyes: Negative.   Respiratory: Positive for cough, chest tightness and wheezing. Negative for apnea, choking, shortness of breath and stridor.   Gastrointestinal: Negative.   Neurological: Negative for dizziness, light-headedness and headaches.       Objective:   There were no vitals taken for this visit.  Patient Active Problem List   Diagnosis Date Noted  . Ulcerative colitis ([place]) 04/24/2017  . Hashimoto's thyroiditis 04/24/2017  . Anxiety 06/22/2015  . 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 Adult hypothyroidism 06/22/2015  . Menorrhagia with regular cycle  06/22/2015  . Adiposity 06/22/2015    Outpatient Encounter Medications as of 12/10/2017  Medication Sig Note  . ALPRAZolam (XANAX) 0.5 MG tablet Take by mouth. 06/22/2015: Medication taken as needed.  Received from: [facility]  . benzonatate (TESSALON) 100 MG capsule TAKE 1 CAPSULE (100 MG TOTAL) BY MOUTH 3 (THREE) TIMES A DAY AS NEEDED FOR COUGH FOR UP TO 10 DAYS.   Marland Kitchen levothyroxine (SYNTHROID, LEVOTHROID) 125 MCG tablet Take 1 tablet (125 mcg total) by mouth daily before breakfast.   . Multiple Vitamins-Minerals (MULTIVITAMIN ADULT PO) Take 1 tablet by mouth daily.   Marland Kitchen ofloxacin (OCUFLOX) 0.3 % ophthalmic solution INSTILL 10 DROPS INTO BOTH EARS EVERY DAY FOR 7 DAYS   . amoxicillin-clavulanate (AUGMENTIN) 875-125 MG tablet Take 1 tablet by mouth 2 (two) times daily. for 10 days   . HYDROcodone-homatropine (HYCODAN) 5-1.5 MG/5ML syrup Take 5 mLs by mouth at bedtime as needed for cough.   Marland Kitchen levofloxacin (LEVAQUIN) 500 MG tablet Take 1 tablet (500 mg total) by mouth daily.   Marland Kitchen oseltamivir (TAMIFLU) 75 MG capsule Take 1 capsule (75 mg total) by mouth 2 (two) times daily.   . predniSONE (DELTASONE) 10 MG tablet Take 1 tablet (10 mg total) by mouth 2 (two) times daily with a meal for 5 days.   . [DISCONTINUED] HYDROcodone-homatropine (HYCODAN) 5-1.5 MG/5ML syrup Take 5 mLs by mouth at bedtime as needed for cough.   . [DISCONTINUED] predniSONE (DELTASONE) 10 MG tablet Take 1 tablet (10 mg total) by mouth daily with breakfast for 5 days.    No facility-administered encounter medications on file as of 12/10/2017.     Allergies  Allergen Reactions  . Cortisporin  [Neomycin-Polymyxin-Hc] Swelling       Physical Exam  Constitutional: She is oriented to person, place, and time. She appears well-developed and well-nourished.  HENT:  Right Ear: External ear normal.  Left Ear: External ear normal.  Mouth/Throat: Oropharynx is clear and moist. No oropharyngeal exudate.  Eyes:  Conjunctivae are normal.  Neck: Neck supple.  Cardiovascular: Normal rate and regular rhythm.  Pulmonary/Chest: Effort normal and breath sounds normal. She has no wheezes.  Lymphadenopathy:    She has no cervical adenopathy.  Neurological: She is alert and oriented to person, place, and time.  Skin: Skin is warm and dry.  Psychiatric: She has a normal mood and affect. Her behavior is normal.       Assessment & Plan:   1. Flu-like symptoms  Counseled on course of illness, one week of feeling very poorly and several weeks of recovery thereafter.  2. Cough  Do not hear wheezing on exam. Patient says she was wheezing yesterday and that "she does well with prednisone." Treat as below.  - predniSONE (DELTASONE) 10 MG tablet; Take 1 tablet (10 mg total) by mouth 2 (two) times daily with a meal for 5 days.  Dispense: 10 tablet; Refill: 0 - HYDROcodone-homatropine (HYCODAN) 5-1.5 MG/5ML syrup; Take 5 mLs by mouth at bedtime as needed for cough.  Dispense: 120 mL; Refill: 0  3. Wheezing  - predniSONE (DELTASONE) 10 MG tablet; Take 1 tablet (10 mg total) by mouth 2 (two) times daily with a meal for 5 days.  Dispense: 10 tablet; Refill: 0 - HYDROcodone-homatropine (HYCODAN) 5-1.5 MG/5ML syrup; Take 5 mLs by mouth at bedtime as needed for cough.  Dispense: 120 mL; Refill: 0  Return if symptoms worsen or fail to improve.   The entirety of the information documented in the History of Present Illness, Review of Systems and Physical Exam were personally obtained by me. Portions of this information were initially documented by Ashley Royalty, CMA and reviewed by me for thoroughness and accuracy.

## 2018-02-28 ENCOUNTER — Other Ambulatory Visit: Payer: Self-pay | Admitting: Family Medicine

## 2018-02-28 MED ORDER — ALPRAZOLAM 0.5 MG PO TABS: 0.5000 mg | ORAL_TABLET | Freq: Two times a day (BID) | ORAL | 4 refills | Status: DC | PRN

## 2018-02-28 NOTE — Telephone Encounter (Signed)
Patient is requesting a refill on the following medication  ALPRAZolam (XANAX) 0.5 MG tablet  She would like this sent to [facility]

## 2018-02-28 NOTE — Telephone Encounter (Signed)
Please advise. Thanks.  

## 2018-05-29 ENCOUNTER — Other Ambulatory Visit: Payer: Self-pay | Admitting: Family Medicine

## 2018-05-29 DIAGNOSIS — E038 Other specified hypothyroidism: Secondary | ICD-10-CM

## 2018-05-29 NOTE — Telephone Encounter (Signed)
[facility] [place] [facility]ed a refill request for the following medication. Thanks CC  levothyroxine (SYNTHROID, LEVOTHROID) 125 MCG tablet

## 2018-05-31 MED ORDER — LEVOTHYROXINE SODIUM 125 MCG PO TABS: 125.0000 ug | ORAL_TABLET | Freq: Every day | ORAL | 3 refills | Status: DC

## 2018-09-09 ENCOUNTER — Ambulatory Visit (INDEPENDENT_AMBULATORY_CARE_PROVIDER_SITE_OTHER): Payer: BLUE CROSS/BLUE SHIELD | Admitting: Family Medicine

## 2018-09-09 ENCOUNTER — Encounter: Payer: Self-pay | Admitting: Family Medicine

## 2018-09-09 ENCOUNTER — Encounter: Payer: Self-pay | Admitting: *Deleted

## 2018-09-09 VITALS — BP 112/64 | HR 78 | Temp 98.9°F | Resp 16 | Ht 64.0 in | Wt 225.0 lb

## 2018-09-09 DIAGNOSIS — Z23 Encounter for immunization: Secondary | ICD-10-CM

## 2018-09-09 DIAGNOSIS — K644 Residual hemorrhoidal skin tags: Secondary | ICD-10-CM | POA: Diagnosis not present

## 2018-09-09 NOTE — Progress Notes (Signed)
Patient: Jodi Carpenter Female    DOB: Jan 17, 1981   37 y.o.   MRN: [id number] Visit Date: 09/09/2018  Today's Provider: Wilhemena Durie, MD   Chief Complaint  Patient presents with  . Hemorrhoids   Subjective:    HPI Patient comes in today wanting to discuss possible hemorrhoid removal. She reports that she has this often. She has occasional constipation but it is very intermittent. She is frustrated with the issue.    Allergies  Allergen Reactions  . Cortisporin  [Neomycin-Polymyxin-Hc] Swelling     Current Outpatient Medications:  .  ALPRAZolam (XANAX) 0.5 MG tablet, Take 1 tablet (0.5 mg total) by mouth 2 (two) times daily as needed for anxiety., Disp: 45 tablet, Rfl: 4 .  levothyroxine (SYNTHROID, LEVOTHROID) 125 MCG tablet, Take 1 tablet (125 mcg total) by mouth daily before breakfast., Disp: 90 tablet, Rfl: 3 .  Multiple Vitamins-Minerals (MULTIVITAMIN ADULT PO), Take 1 tablet by mouth daily., Disp: , Rfl:  .  amoxicillin-clavulanate (AUGMENTIN) 875-125 MG tablet, Take 1 tablet by mouth 2 (two) times daily. for 10 days, Disp: , Rfl: 0 .  benzonatate (TESSALON) 100 MG capsule, TAKE 1 CAPSULE (100 MG TOTAL) BY MOUTH 3 (THREE) TIMES A DAY AS NEEDED FOR COUGH FOR UP TO 10 DAYS., Disp: , Rfl: 0 .  HYDROcodone-homatropine (HYCODAN) 5-1.5 MG/5ML syrup, Take 5 mLs by mouth at bedtime as needed for cough. (Patient not taking: Reported on 09/09/2018), Disp: 120 mL, Rfl: 0 .  levofloxacin (LEVAQUIN) 500 MG tablet, Take 1 tablet (500 mg total) by mouth daily. (Patient not taking: Reported on 09/09/2018), Disp: 7 tablet, Rfl: 0 .  ofloxacin (OCUFLOX) 0.3 % ophthalmic solution, INSTILL 10 DROPS INTO BOTH EARS EVERY DAY FOR 7 DAYS, Disp: , Rfl: 0 .  oseltamivir (TAMIFLU) 75 MG capsule, Take 1 capsule (75 mg total) by mouth 2 (two) times daily. (Patient not taking: Reported on 09/09/2018), Disp: 10 capsule, Rfl: 0  Review of Systems  Constitutional: Negative.   HENT:  Negative.   Eyes: Negative.   Respiratory: Negative.   Cardiovascular: Negative.   Gastrointestinal: Positive for constipation. Negative for abdominal distention, abdominal pain, anal bleeding, blood in stool, diarrhea, nausea, rectal pain and vomiting.  Endocrine: Negative.   Genitourinary: Negative.   Musculoskeletal: Negative.   Allergic/Immunologic: Negative.   Neurological: Negative.   Hematological: Negative.   Psychiatric/Behavioral: Negative.     Social History   Tobacco Use  . Smoking status: Former Smoker    Packs/day: 0.25    Years: 8.00    Pack years: 2.00    Types: Cigarettes  . Smokeless tobacco: Never Used  Substance Use Topics  . Alcohol use: Yes    Comment: 1-2 a month   Objective:   BP 112/64 (BP Location: Left Arm, Patient Position: Sitting, Cuff Size: Large)   Pulse 78   Temp 98.9 F (37.2 C)   Resp 16   Ht 5\' 4"  (1.626 m)   Wt 225 lb (102.1 kg) Comment: per patient  SpO2 99%   BMI 38.62 kg/m  Vitals:   09/09/18 1023  BP: 112/64  Pulse: 78  Resp: 16  Temp: 98.9 F (37.2 C)  SpO2: 99%  Weight: 225 lb (102.1 kg)  Height: 5\' 4"  (1.626 m)     Physical Exam  Constitutional: She is oriented to person, place, and time. She appears well-developed and well-nourished.  HENT:  Head: Normocephalic and atraumatic.  Eyes: No scleral icterus.  Cardiovascular: Normal  rate and regular rhythm.  Pulmonary/Chest: Effort normal.  Abdominal: Soft.  Genitourinary:  Genitourinary Comments: Perianal area with hemorrhoidal skin tags.  Neurological: She is alert and oriented to person, place, and time.  Skin: Skin is warm and dry.  Psychiatric: She has a normal mood and affect. Her behavior is normal. Judgment and thought content normal.        Assessment & Plan:     1. Hemorrhoidal skin tags  - Ambulatory referral to General Surgery  2. Flu vaccine need  - Flu Vaccine QUAD 6+ mos PF IM (Fluarix Quad PF)  I have done the exam and reviewed the  chart and it is accurate to the best of my knowledge. Development worker, community has been used and  any errors in dictation or transcription are unintentional. Miguel Aschoff M.D. [place], MD  [facility]

## 2018-09-17 NOTE — Progress Notes (Signed)
Gynecology Annual Exam  PCP: Jerrol Banana., MD  Chief Complaint:  Chief Complaint  Patient presents with  . Gynecologic Exam    nexplanon insertion    History of Present Illness:Jodi Carpenter presents today for her annual exam. She is a 37 year old Caucasian/White female, G0 P0000, whose LMP is today.. She is having problems with menorrhagia. Her menses are regular. They occur every month, they last 6-7 days, are heavy flow x 3-4 days requiring pad and tampon change every 1-2 hours and are with clots. She says that Marshell Levan has helped reduce her bleeding. She has dysmenorrhea during the heavy part of her menses. . She takes Tylenol but it is not usually effective.  She also reports Mittleschmerz and midcycle bleeding The patient's past medical history is remarkable for ulcerative colitis, Hashimoto's thyroiditis/ hypothyroidism, obesity, and infertility. She has had an extensive infertility work up. She recently divorced  her husband and is not wishing to conceive at this time. She desires to start contraception.  Since her last annual GYN exam 04/24/2017, she has not had any significant changes to her health  She is sexually active and reports no problems with intercourse. She is currently uses condoms for contraception. Her last ultrasound in 2016 was c/w adenomyosis  Her most recent Pap smear was 04/24/2017  and was NIL/neg HRHPV . No history of abnormal Pap smears  Mammogram is not applicable. There is no family history of breast cancer The patient does not do monthly self breast exams.  The patient does not smoke The patient does drink 2-3 drinks/month. The patient does not use drugs The patient does exercise regularly 3-5x/week. She may get adequate calcium in her diet.  Patient reports no symptoms of concern as it pertains to depression or anxiety.       Review of Systems: Review of Systems  Constitutional: Positive for fever and malaise/fatigue. Negative for chills  and weight loss.  HENT: Positive for congestion. Negative for sinus pain and sore throat.   Eyes: Negative for blurred vision and pain.  Respiratory: Positive for cough. Negative for hemoptysis, shortness of breath and wheezing.   Cardiovascular: Negative for chest pain, palpitations and leg swelling.  Gastrointestinal: Negative for abdominal pain, blood in stool, diarrhea (after sigmoid colectomy), heartburn, nausea and vomiting.  Genitourinary: Negative for dysuria, frequency, hematuria and urgency.       Positive for menorrhagia and dysmenorrhea  Musculoskeletal: Negative for back pain, joint pain and myalgias.  Skin: Negative for itching and rash.  Neurological: Negative for dizziness, tingling and headaches.  Endo/Heme/Allergies: Positive for environmental allergies (and sneezing). Negative for polydipsia. Does not bruise/bleed easily.       Negative for hirsutism   Psychiatric/Behavioral: Negative for depression. The patient is not nervous/anxious and does not have insomnia.     Past Medical History:  Past Medical History:  Diagnosis Date  . Adenomyosis 03/12/2015  . Anxiety   . Colitis   . Depression   . Headache   . Infertility, female   . Obesity (BMI 35.0-39.9 without comorbidity)   . Thyroid disease 2012   Hashimoto's thyroiditis/ hypothyroidism    Past Surgical History:  Past Surgical History:  Procedure Laterality Date  . LEFT COLECTOMY  05/23/2010   sigmoid colectomy with J pouch  . MASS EXCISION     pilonidal cyst and abscess  . OTHER SURGICAL HISTORY     illeionidal anal stenosis   . TONSILLECTOMY  Family History:  Family History  Problem Relation Age of Onset  . Hypertension Mother   . Panic disorder Mother   . Hypertension Father   . Diverticulosis Father   . Prostate cancer Maternal Grandfather     Social History:  Social History   Socioeconomic History  . Marital status: Divorced    Spouse name: Not on file  . Number of children: 0    . Years of education: 56  . Highest education level: Not on file  Occupational History  . Occupation: Tree surgeon  Social Needs  . Financial resource strain: Not on file  . Food insecurity:    Worry: Not on file    Inability: Not on file  . Transportation needs:    Medical: Not on file    Non-medical: Not on file  Tobacco Use  . Smoking status: Former Smoker    Packs/day: 0.25    Years: 8.00    Pack years: 2.00    Types: Cigarettes  . Smokeless tobacco: Never Used  Substance and Sexual Activity  . Alcohol use: Yes    Comment: 1-2 a month  . Drug use: No  . Sexual activity: Yes    Birth control/protection: None  Lifestyle  . Physical activity:    Days per week: Not on file    Minutes per session: Not on file  . Stress: Not on file  Relationships  . Social connections:    Talks on phone: Not on file    Gets together: Not on file    Attends religious service: Not on file    Active member of club or organization: Not on file    Attends meetings of clubs or organizations: Not on file    Relationship status: Not on file  . Intimate partner violence:    Fear of current or ex partner: Not on file    Emotionally abused: Not on file    Physically abused: Not on file    Forced sexual activity: Not on file  Other Topics Concern  . Not on file  Social History Narrative  . Not on file    Allergies:  Allergies  Allergen Reactions  . Cortisporin  [Neomycin-Polymyxin-Hc] Swelling    Medications: Current Outpatient Medications on File Prior to Visit  Medication Sig Dispense Refill  . levothyroxine (SYNTHROID, LEVOTHROID) 125 MCG tablet Take 1 tablet (125 mcg total) by mouth daily before breakfast. 90 tablet 3  . Multiple Vitamins-Minerals (MULTIVITAMIN ADULT PO) Take 1 tablet by mouth daily.     No current facility-administered medications on file prior to visit.    Physical Exam Vitals: BP 120/70   Pulse 96   Ht 5\' 4"  (1.626 m)   Wt 236 lb 8 oz (107.3 kg)    LMP 08/23/2018   BMI 40.60 kg/m General: pleasant WF in NAD HEENT: normocephalic, anicteric Neck: no thyroid enlargement, no palpable nodules, no cervical lymphadenopathy  Pulmonary: No increased work of breathing, CTAB Cardiovascular: RRR, without murmur  Breast: Breast symmetrical, no tenderness, no palpable nodules or masses, no skin or nipple retraction present, no nipple discharge.  No axillary, infraclavicular or supraclavicular lymphadenopathy. Abdomen: Soft, non-tender, non-distended.  Umbilicus without lesions.  No hepatomegaly or masses palpable. No evidence of hernia. Genitourinary:  External: Normal external female genitalia.  Normal urethral meatus, normal  Bartholin's and Skene's glands.    Vagina: Normal vaginal mucosa, no evidence of prolapse.    Cervix: Grossly normal in appearance, no bleeding, non-tender  Uterus: Anteverted, globular,  8 week size, mobile, and non-tender  Adnexa: No adnexal masses, non-tender  Rectal: deferred  Lymphatic: no evidence of inguinal lymphadenopathy Extremities: no edema, erythema, or tenderness Neurologic: Grossly intact Psychiatric: mood appropriate, affect full     Assessment: 37 y.o. G0P0000 well woman exam Menorrhagia/ suspected adenomyosis Primary infertility  Plan:    1) Breast cancer screening - recommend monthly self breast exam. Recommend starting annual mammograms at age 19 2) Cervical cancer screening - Pap was done. ASCCP guidelines and rational discussed.  Patient opts for yearly screening interval 3) Contraception -interested in a form of contraception that will also help reduce bleeding and cramping, specifically Depo, Progestin IUD, pills. She is interested in a Mirena IUD. Will return in 2 days for insertion. Discussed premedicating with Cytotec and Motrin 1 hour prior to IUD insertion. Discussed procedure, effectiveness and MOA.  4) Routine healthcare maintenance including cholesterol and diabetes screening managed  by PCP.    Dalia Heading, CNM

## 2018-09-18 ENCOUNTER — Other Ambulatory Visit (HOSPITAL_COMMUNITY)
Admission: RE | Admit: 2018-09-18 | Discharge: 2018-09-18 | Disposition: A | Discharge status: Home / Self Care | Payer: BLUE CROSS/BLUE SHIELD | Source: Ambulatory Visit | Attending: Certified Nurse Midwife | Admitting: Certified Nurse Midwife

## 2018-09-18 ENCOUNTER — Ambulatory Visit (INDEPENDENT_AMBULATORY_CARE_PROVIDER_SITE_OTHER): Payer: BLUE CROSS/BLUE SHIELD | Admitting: Certified Nurse Midwife

## 2018-09-18 ENCOUNTER — Telehealth: Payer: Self-pay | Admitting: Certified Nurse Midwife

## 2018-09-18 ENCOUNTER — Encounter: Payer: Self-pay | Admitting: Certified Nurse Midwife

## 2018-09-18 VITALS — BP 120/70 | HR 96 | Ht 64.0 in | Wt 236.5 lb

## 2018-09-18 DIAGNOSIS — Z01419 Encounter for gynecological examination (general) (routine) without abnormal findings: Secondary | ICD-10-CM | POA: Diagnosis present

## 2018-09-18 DIAGNOSIS — Z01411 Encounter for gynecological examination (general) (routine) with abnormal findings: Secondary | ICD-10-CM

## 2018-09-18 DIAGNOSIS — Z113 Encounter for screening for infections with a predominantly sexual mode of transmission: Secondary | ICD-10-CM | POA: Diagnosis not present

## 2018-09-18 DIAGNOSIS — Z124 Encounter for screening for malignant neoplasm of cervix: Secondary | ICD-10-CM

## 2018-09-18 DIAGNOSIS — N920 Excessive and frequent menstruation with regular cycle: Secondary | ICD-10-CM

## 2018-09-18 MED ORDER — MISOPROSTOL 200 MCG PO TABS: 200.0000 ug | ORAL_TABLET | Freq: Once | ORAL | 0 refills | Status: DC

## 2018-09-18 NOTE — Telephone Encounter (Signed)
Mirena reserved for this patient. 

## 2018-09-18 NOTE — Telephone Encounter (Signed)
Patient scheduled 10/25 with [facility]G for Mirena

## 2018-09-20 ENCOUNTER — Telehealth: Payer: Self-pay

## 2018-09-20 ENCOUNTER — Ambulatory Visit (INDEPENDENT_AMBULATORY_CARE_PROVIDER_SITE_OTHER): Payer: BLUE CROSS/BLUE SHIELD | Admitting: Surgery

## 2018-09-20 ENCOUNTER — Encounter: Payer: Self-pay | Admitting: Surgery

## 2018-09-20 ENCOUNTER — Ambulatory Visit (INDEPENDENT_AMBULATORY_CARE_PROVIDER_SITE_OTHER): Payer: BLUE CROSS/BLUE SHIELD | Admitting: Certified Nurse Midwife

## 2018-09-20 VITALS — BP 112/70 | HR 78 | Ht 64.0 in | Wt 236.0 lb

## 2018-09-20 VITALS — BP 130/80 | HR 74 | Temp 97.9°F | Ht 67.0 in | Wt 235.0 lb

## 2018-09-20 DIAGNOSIS — Z30430 Encounter for insertion of intrauterine contraceptive device: Secondary | ICD-10-CM

## 2018-09-20 DIAGNOSIS — K644 Residual hemorrhoidal skin tags: Secondary | ICD-10-CM | POA: Diagnosis not present

## 2018-09-20 LAB — CYTOLOGY - PAP
CHLAMYDIA, DNA PROBE: NEGATIVE
DIAGNOSIS: NEGATIVE
Neisseria Gonorrhea: NEGATIVE

## 2018-09-20 NOTE — Addendum Note (Signed)
Addended by: Riki Sheer on: 09/20/2018 03:08 PM   Modules accepted: Orders, SmartSet

## 2018-09-20 NOTE — Patient Instructions (Addendum)
Patient to be scheduled for hemorrhoidectomy.   The patient is schedule for surgery at [facility] with Dr Hampton Abbot on 10/03/18. She will pre admit by phone. The patient is aware of date and instructions.   How to Take a Sitz Bath A sitz bath is a warm water bath that is taken while you are sitting down. The water should only come up to your hips and should cover your buttocks. Your health care provider may recommend a sitz bath to help you:  Clean the lower part of your body, including your genital area.  With itching.  With pain.  With sore muscles or muscles that tighten or spasm.  How to take a sitz bath Take 3-4 sitz baths per day or as told by your health care provider. 1. Partially fill a bathtub with warm water. You will only need the water to be deep enough to cover your hips and buttocks when you are sitting in it. 2. If your health care provider told you to put medicine in the water, follow the directions exactly. 3. Sit in the water and open the tub drain a little. 4. Turn on the warm water again to keep the tub at the correct level. Keep the water running constantly. 5. Soak in the water for 15-20 minutes or as told by your health care provider. 6. After the sitz bath, pat the affected area dry first. Do not rub it. 7. Be careful when you stand up after the sitz bath because you may feel dizzy.  Contact a health care provider if:  Your symptoms get worse. Do not continue with sitz baths if your symptoms get worse.  You have new symptoms. Do not continue with sitz baths until you talk with your health care provider. This information is not intended to replace advice given to you by your health care provider. Make sure you discuss any questions you have with your health care provider. Document Released: 08/05/2004 Document Revised: 04/12/2016 Document Reviewed: 11/11/2014 Elsevier Interactive Patient Education  [employer].

## 2018-09-20 NOTE — Progress Notes (Signed)
09/20/2018  Reason for Visit:  Hemorrhoidal skin tags  Referring Provider:  Miguel Aschoff, MD  History of Present Illness: Jodi Carpenter is a 37 y.o. female presenting for evaluation of hemorrhoidal skin tags.  She reports a history of ulcerative colitis and had a total colectomy with J pouch-anal anastomosis at [facility] in 2011.  She later developed external hemorrhoids and has had flare-ups in the past with episodes of perianal pain.  She has not needed any I&D procedures for thrombosed hemorrhoids.  She has since developed skin tags at the areas of external hemorrhoids and they do cause pain from time to time.  She denies any drainage, redness of the skin, or abscess formation.  Denies any fevers, chills, constipation.  Past Medical History: Past Medical History:  Diagnosis Date  . Adenomyosis 03/12/2015  . Anxiety   . Colitis   . Depression   . Headache   . Infertility, female   . Obesity (BMI 35.0-39.9 without comorbidity)   . Thyroid disease 2012   Hashimoto's thyroiditis/ hypothyroidism     Past Surgical History: Past Surgical History:  Procedure Laterality Date  . LEFT COLECTOMY  05/23/2010   sigmoid colectomy with J pouch  . MASS EXCISION     pilonidal cyst and abscess  . OTHER SURGICAL HISTORY     illeionidal anal stenosis   . TONSILLECTOMY      Home Medications: Prior to Admission medications   Medication Sig Start Date End Date Taking? Authorizing Provider  levothyroxine (SYNTHROID, LEVOTHROID) 125 MCG tablet Take 1 tablet (125 mcg total) by mouth daily before breakfast. 05/31/18  Yes Jerrol Banana., MD  Multiple Vitamins-Minerals (MULTIVITAMIN ADULT PO) Take 1 tablet by mouth daily.   Yes [provider]  levonorgestrel (MIRENA) 20 MCG/24HR IUD 1 each by Intrauterine route once.    [provider]  misoprostol (CYTOTEC) 200 MCG tablet Take 1 tablet (200 mcg total) by mouth once for 1 dose. 09/18/18 09/18/18  Dalia Heading, CNM     Allergies: Allergies  Allergen Reactions  . Cortisporin  [Neomycin-Polymyxin-Hc] Swelling    Social History:  reports that she has quit smoking. Her smoking use included cigarettes. She has a 2.00 pack-year smoking history. She has never used smokeless tobacco. She reports that she drinks alcohol. She reports that she does not use drugs.   Family History: Family History  Problem Relation Age of Onset  . Hypertension Mother   . Panic disorder Mother   . Hypertension Father   . Diverticulosis Father   . Prostate cancer Maternal Grandfather     Review of Systems: Review of Systems  Constitutional: Negative for chills and fever.  HENT: Negative for hearing loss.   Eyes: Negative for blurred vision.  Respiratory: Negative for shortness of breath.   Cardiovascular: Negative for chest pain.  Gastrointestinal: Negative for abdominal pain, constipation, nausea and vomiting.       Perianal discomfort where skin tags are located  Genitourinary: Negative for dysuria.  Musculoskeletal: Negative for myalgias.  Skin: Negative for rash.  Neurological: Negative for dizziness.  Psychiatric/Behavioral: Negative for depression.    Physical Exam BP 130/80   Pulse 74   Temp 97.9 F (36.6 C) (Skin)   Ht 5\' 7"  (1.702 m)   Wt 235 lb (106.6 kg)   LMP 08/23/2018   BMI 36.81 kg/m  CONSTITUTIONAL: No acute distress HEENT:  Normocephalic, atraumatic, extraocular motion intact. NECK: Trachea is midline, and there is no jugular venous distension.  RESPIRATORY:  Lungs are clear, and breath sounds are equal bilaterally. Normal respiratory effort without pathologic use of accessory muscles. CARDIOVASCULAR: Heart is regular without murmurs, gallops, or rubs. GI: The abdomen is soft, nondistended, nontender. RECTAL:  External exam reveals external hemorrhoids on all columns, with left lateral being largest and with mild swelling.  There is some discomfort on palpation of this one, but there is no  evidence of thrombosis or abscess.  There is an associated skin tag.  Digital exam was uncomfortable for patient due to prior stenosis, but no lesions palpable on exam. MUSCULOSKELETAL:  Normal muscle strength and tone in all four extremities.  No peripheral edema or cyanosis. SKIN: Skin turgor is normal. There are no pathologic skin lesions.  NEUROLOGIC:  Motor and sensation is grossly normal.  Cranial nerves are grossly intact. PSYCH:  Alert and oriented to person, place and time. Affect is normal.  Laboratory Analysis: No results found for this or any previous visit (from the past 24 hour(s)).  Imaging: No results found.  Assessment and Plan: This is a 36 y.o. female with hemorrhoidal skin tags and external hemorrhoids.    Discussed with the patient that we can excise the external hemorrhoids and skin tag associated, and this is done in the operating room.  She is ready for surgery and would like to proceed.  Discussed the risks of bleeding, infection, and injury to surrounding structures and she understands the plan for exam under anesthesia with excision of skin tag/external hemorrhoid.  She is aware of the post-op discomfort and possible pain which will be treated with oral medication and local anesthetic in the OR.  We'll schedule her for surgery on 11/7, as an outpatient procedure.  Face-to-face time spent with the patient and care providers was 60 minutes, with more than 50% of the time spent counseling, educating, and coordinating care of the patient.     Melvyn Neth, [facility]

## 2018-09-20 NOTE — H&P (View-Only) (Signed)
09/20/2018  Reason for Visit:  Hemorrhoidal skin tags  Referring Provider:  Miguel Aschoff, MD  History of Present Illness: Jodi Carpenter is a 37 y.o. female presenting for evaluation of hemorrhoidal skin tags.  She reports a history of ulcerative colitis and had a total colectomy with J pouch-anal anastomosis at [facility] in 2011.  She later developed external hemorrhoids and has had flare-ups in the past with episodes of perianal pain.  She has not needed any I&D procedures for thrombosed hemorrhoids.  She has since developed skin tags at the areas of external hemorrhoids and they do cause pain from time to time.  She denies any drainage, redness of the skin, or abscess formation.  Denies any fevers, chills, constipation.  Past Medical History: Past Medical History:  Diagnosis Date  . Adenomyosis 03/12/2015  . Anxiety   . Colitis   . Depression   . Headache   . Infertility, female   . Obesity (BMI 35.0-39.9 without comorbidity)   . Thyroid disease 2012   Hashimoto's thyroiditis/ hypothyroidism     Past Surgical History: Past Surgical History:  Procedure Laterality Date  . LEFT COLECTOMY  05/23/2010   sigmoid colectomy with J pouch  . MASS EXCISION     pilonidal cyst and abscess  . OTHER SURGICAL HISTORY     illeionidal anal stenosis   . TONSILLECTOMY      Home Medications: Prior to Admission medications   Medication Sig Start Date End Date Taking? Authorizing Provider  levothyroxine (SYNTHROID, LEVOTHROID) 125 MCG tablet Take 1 tablet (125 mcg total) by mouth daily before breakfast. 05/31/18  Yes Jerrol Banana., MD  Multiple Vitamins-Minerals (MULTIVITAMIN ADULT PO) Take 1 tablet by mouth daily.   Yes [provider]  levonorgestrel (MIRENA) 20 MCG/24HR IUD 1 each by Intrauterine route once.    [provider]  misoprostol (CYTOTEC) 200 MCG tablet Take 1 tablet (200 mcg total) by mouth once for 1 dose. 09/18/18 09/18/18  Dalia Heading, CNM     Allergies: Allergies  Allergen Reactions  . Cortisporin  [Neomycin-Polymyxin-Hc] Swelling    Social History:  reports that she has quit smoking. Her smoking use included cigarettes. She has a 2.00 pack-year smoking history. She has never used smokeless tobacco. She reports that she drinks alcohol. She reports that she does not use drugs.   Family History: Family History  Problem Relation Age of Onset  . Hypertension Mother   . Panic disorder Mother   . Hypertension Father   . Diverticulosis Father   . Prostate cancer Maternal Grandfather     Review of Systems: Review of Systems  Constitutional: Negative for chills and fever.  HENT: Negative for hearing loss.   Eyes: Negative for blurred vision.  Respiratory: Negative for shortness of breath.   Cardiovascular: Negative for chest pain.  Gastrointestinal: Negative for abdominal pain, constipation, nausea and vomiting.       Perianal discomfort where skin tags are located  Genitourinary: Negative for dysuria.  Musculoskeletal: Negative for myalgias.  Skin: Negative for rash.  Neurological: Negative for dizziness.  Psychiatric/Behavioral: Negative for depression.    Physical Exam BP 130/80   Pulse 74   Temp 97.9 F (36.6 C) (Skin)   Ht 5\' 7"  (1.702 m)   Wt 235 lb (106.6 kg)   LMP 08/23/2018   BMI 36.81 kg/m  CONSTITUTIONAL: No acute distress HEENT:  Normocephalic, atraumatic, extraocular motion intact. NECK: Trachea is midline, and there is no jugular venous distension.  RESPIRATORY:  Lungs are clear, and breath sounds are equal bilaterally. Normal respiratory effort without pathologic use of accessory muscles. CARDIOVASCULAR: Heart is regular without murmurs, gallops, or rubs. GI: The abdomen is soft, nondistended, nontender. RECTAL:  External exam reveals external hemorrhoids on all columns, with left lateral being largest and with mild swelling.  There is some discomfort on palpation of this one, but there is no  evidence of thrombosis or abscess.  There is an associated skin tag.  Digital exam was uncomfortable for patient due to prior stenosis, but no lesions palpable on exam. MUSCULOSKELETAL:  Normal muscle strength and tone in all four extremities.  No peripheral edema or cyanosis. SKIN: Skin turgor is normal. There are no pathologic skin lesions.  NEUROLOGIC:  Motor and sensation is grossly normal.  Cranial nerves are grossly intact. PSYCH:  Alert and oriented to person, place and time. Affect is normal.  Laboratory Analysis: No results found for this or any previous visit (from the past 24 hour(s)).  Imaging: No results found.  Assessment and Plan: This is a 37 y.o. female with hemorrhoidal skin tags and external hemorrhoids.    Discussed with the patient that we can excise the external hemorrhoids and skin tag associated, and this is done in the operating room.  She is ready for surgery and would like to proceed.  Discussed the risks of bleeding, infection, and injury to surrounding structures and she understands the plan for exam under anesthesia with excision of skin tag/external hemorrhoid.  She is aware of the post-op discomfort and possible pain which will be treated with oral medication and local anesthetic in the OR.  We'll schedule her for surgery on 11/7, as an outpatient procedure.  Face-to-face time spent with the patient and care providers was 60 minutes, with more than 50% of the time spent counseling, educating, and coordinating care of the patient.     Melvyn Neth, [facility]

## 2018-09-20 NOTE — Telephone Encounter (Signed)
Pt calling triage stating she has an IUD insertion today with [facility] and was prescribed misoprostol (Cytotec) and was told by [facility] to insert it vaginally but pharmacist told her by mouth. Confirmed with CLG and she said mouth. Pt aware.

## 2018-09-20 NOTE — Progress Notes (Signed)
    GYNECOLOGY OFFICE PROCEDURE NOTE  Jodi Carpenter is a 37 y.o. G0P0000 here for Mirena IUD insertion for treatment of menorrhagia and for contraception.  Last pap smear was on 09/18/2018 and results are pending. Using condoms for contraception Her LMP was 2 days ago.   IUD Insertion Procedure Note Patient identified, informed consent performed, consent signed.   Discussed risks of irregular bleeding, cramping, infection, expulsion,malpositioning or misplacement of the IUD outside the uterus which may require further procedure such as laparoscopy. Time out was performed.    On bimanual exam, uterus was Anteverted Speculum placed in the vagina.  Cervix visualized.  Cleaned with Betadine x 2. Cervix was sprayed with Hurricaine anesthetic and  grasped anteriorly with a single tooth tenaculum.  Uterus sounded to 6 cm.  Mirena  IUD placed per manufacturer's recommendations.  Strings trimmed to 3 cm. Tenaculum was removed, and silver nitrate was applied to tenaculum sites for hemostasis.  Patient tolerated procedure well.   Patient was given post-procedure instructions.   Patient was also asked to check IUD strings periodically and follow up in 4 weeks for IUD check.  Dalia Heading, CNM 09/20/18

## 2018-09-21 ENCOUNTER — Encounter: Payer: Self-pay | Admitting: Certified Nurse Midwife

## 2018-09-23 ENCOUNTER — Telehealth: Payer: Self-pay | Admitting: Family Medicine

## 2018-09-23 DIAGNOSIS — E669 Obesity, unspecified: Secondary | ICD-10-CM

## 2018-09-23 NOTE — Telephone Encounter (Signed)
Pt stated that at her LOV with Dr. Rosanna Randy discussed referring pt to the weight loss surgeon across the street. Pt stated that she would like to move forward with that referral. Please advise. Thanks TNP

## 2018-09-23 NOTE — Telephone Encounter (Signed)
Ok to refer? Which doctor?

## 2018-09-25 NOTE — Telephone Encounter (Signed)
Ordered referral.   Thanks,   -Mickel Baas

## 2018-09-25 NOTE — Telephone Encounter (Signed)
Dr Johnathan Hausen

## 2018-09-27 ENCOUNTER — Encounter
Admission: RE | Admit: 2018-09-27 | Discharge: 2018-09-27 | Disposition: A | Discharge status: Home / Self Care | Payer: BLUE CROSS/BLUE SHIELD | Source: Ambulatory Visit | Attending: Surgery | Admitting: Surgery

## 2018-09-27 ENCOUNTER — Other Ambulatory Visit: Payer: Self-pay

## 2018-09-27 HISTORY — DX: Hypothyroidism, unspecified: E03.9

## 2018-09-27 NOTE — Patient Instructions (Signed)
Your procedure is scheduled on: 10-03-18  Report to Same Day Surgery 2nd floor medical mall [facility] Entrance-take elevator on left to 2nd floor.  Check in with surgery information desk.) To find out your arrival time please call [phone] between 1PM - 3PM on 10-02-18  Remember: Instructions that are not followed completely may result in serious medical risk, up to and including death, or upon the discretion of your surgeon and anesthesiologist your surgery may need to be rescheduled.    _x___ 1. Do not eat food after midnight the night before your procedure. You may drink clear liquids up to 2 hours before you are scheduled to arrive at the hospital for your procedure.  Do not drink clear liquids within 2 hours of your scheduled arrival to the hospital.  Clear liquids include  --Water or Apple juice without pulp  --Clear carbohydrate beverage such as ClearFast or Gatorade  --Black Coffee or Clear Tea (No milk, no creamers, do not add anything to  the coffee or Tea   ____Ensure clear carbohydrate drink on the way to the hospital for bariatric patients  ____Ensure clear carbohydrate drink 3 hours before surgery for Dr Dwyane Luo patients if physician instructed.   No gum chewing or hard candies.     __x__ 2. No Alcohol for 24 hours before or after surgery.   __x__3. No Smoking or e-cigarettes for 24 prior to surgery.  Do not use any chewable tobacco products for at least 6 hour prior to surgery   ____  4. Bring all medications with you on the day of surgery if instructed.    __x__ 5. Notify your doctor if there is any change in your medical condition     (cold, fever, infections).    x___6. On the morning of surgery brush your teeth with toothpaste and water.  You may rinse your mouth with mouth wash if you wish.  Do not swallow any toothpaste or mouthwash.   Do not wear jewelry, make-up, hairpins, clips or nail polish.  Do not wear lotions, powders, or perfumes. You may wear  deodorant.  Do not shave 48 hours prior to surgery. Men may shave face and neck.  Do not bring valuables to the hospital.    [facility] is not responsible for any belongings or valuables.               Contacts, dentures or bridgework may not be worn into surgery.  Leave your suitcase in the car. After surgery it may be brought to your room.  For patients admitted to the hospital, discharge time is determined by your treatment team.  _  Patients discharged the day of surgery will not be allowed to drive home.  You will need someone to drive you home and stay with you the night of your procedure.    Please read over the following fact sheets that you were given:   [facility] Preparing for Surgery   _x___ TAKE THE FOLLOWING MEDICATION THE MORNING OF SURGERY WITH A SMALL SIP OF WATER. These include:  1. LEVOTHYROXINE  2.  3.  4.  5.  6.  ____Fleets enema or Magnesium Citrate as directed.   ____ Use CHG Soap or sage wipes as directed on instruction sheet   ____ Use inhalers on the day of surgery and bring to hospital day of surgery  ____ Stop Metformin and Janumet 2 days prior to surgery.    ____ Take 1/2 of usual insulin dose the night  before surgery and none on the morning surgery.   ____ Follow recommendations from Cardiologist, Pulmonologist or PCP regarding stopping Aspirin, Coumadin, Plavix ,Eliquis, Effient, or Pradaxa, and Pletal.  X____Stop Anti-inflammatories such as Advil, Aleve, Ibuprofen, Motrin, Naproxen, Naprosyn, Goodies powders or aspirin products NOW-OK to take Tylenol    ____ Stop supplements until after surgery.     ____ Bring C-Pap to the hospital.

## 2018-10-03 ENCOUNTER — Encounter
Admission: RE | Disposition: A | Discharge status: Home / Self Care | Payer: Self-pay | Source: Ambulatory Visit | Attending: Surgery

## 2018-10-03 ENCOUNTER — Ambulatory Visit: Payer: BLUE CROSS/BLUE SHIELD | Admitting: Anesthesiology

## 2018-10-03 ENCOUNTER — Other Ambulatory Visit: Payer: Self-pay

## 2018-10-03 ENCOUNTER — Encounter: Payer: Self-pay | Admitting: *Deleted

## 2018-10-03 ENCOUNTER — Ambulatory Visit
Admission: RE | Admit: 2018-10-03 | Discharge: 2018-10-03 | Disposition: A | Discharge status: Home / Self Care | Payer: BLUE CROSS/BLUE SHIELD | Source: Ambulatory Visit | Attending: Surgery | Admitting: Surgery

## 2018-10-03 DIAGNOSIS — Z8719 Personal history of other diseases of the digestive system: Secondary | ICD-10-CM

## 2018-10-03 DIAGNOSIS — K603 Anal fistula, unspecified: Secondary | ICD-10-CM

## 2018-10-03 DIAGNOSIS — Z818 Family history of other mental and behavioral disorders: Secondary | ICD-10-CM | POA: Insufficient documentation

## 2018-10-03 DIAGNOSIS — F419 Anxiety disorder, unspecified: Secondary | ICD-10-CM | POA: Insufficient documentation

## 2018-10-03 DIAGNOSIS — K602 Anal fissure, unspecified: Secondary | ICD-10-CM | POA: Diagnosis not present

## 2018-10-03 DIAGNOSIS — E669 Obesity, unspecified: Secondary | ICD-10-CM | POA: Insufficient documentation

## 2018-10-03 DIAGNOSIS — Z888 Allergy status to other drugs, medicaments and biological substances status: Secondary | ICD-10-CM | POA: Insufficient documentation

## 2018-10-03 DIAGNOSIS — F329 Major depressive disorder, single episode, unspecified: Secondary | ICD-10-CM | POA: Diagnosis not present

## 2018-10-03 DIAGNOSIS — R51 Headache: Secondary | ICD-10-CM | POA: Diagnosis not present

## 2018-10-03 DIAGNOSIS — Z8249 Family history of ischemic heart disease and other diseases of the circulatory system: Secondary | ICD-10-CM | POA: Insufficient documentation

## 2018-10-03 DIAGNOSIS — Z79899 Other long term (current) drug therapy: Secondary | ICD-10-CM | POA: Diagnosis not present

## 2018-10-03 DIAGNOSIS — Z87891 Personal history of nicotine dependence: Secondary | ICD-10-CM | POA: Diagnosis not present

## 2018-10-03 DIAGNOSIS — Z8042 Family history of malignant neoplasm of prostate: Secondary | ICD-10-CM | POA: Insufficient documentation

## 2018-10-03 DIAGNOSIS — Z6841 Body Mass Index (BMI) 40.0 and over, adult: Secondary | ICD-10-CM | POA: Insufficient documentation

## 2018-10-03 DIAGNOSIS — E063 Autoimmune thyroiditis: Secondary | ICD-10-CM | POA: Diagnosis not present

## 2018-10-03 DIAGNOSIS — Z9049 Acquired absence of other specified parts of digestive tract: Secondary | ICD-10-CM | POA: Diagnosis not present

## 2018-10-03 DIAGNOSIS — K644 Residual hemorrhoidal skin tags: Secondary | ICD-10-CM | POA: Diagnosis present

## 2018-10-03 HISTORY — PX: RECTAL EXAM UNDER ANESTHESIA: SHX6399

## 2018-10-03 LAB — POCT I-STAT 4, (NA,K, GLUC, HGB,HCT)
Glucose, Bld: 91 mg/dL (ref 70–99)
HEMATOCRIT: 39 % (ref 36.0–46.0)
HEMOGLOBIN: 13.3 g/dL (ref 12.0–15.0)
Potassium: 4.4 mmol/L (ref 3.5–5.1)
Sodium: 138 mmol/L (ref 135–145)

## 2018-10-03 LAB — POCT PREGNANCY, URINE: Preg Test, Ur: NEGATIVE

## 2018-10-03 SURGERY — EXAM UNDER ANESTHESIA, RECTUM
Anesthesia: General

## 2018-10-03 MED ORDER — GELATIN ABSORBABLE 100 CM EX MISC
CUTANEOUS | Status: AC
  Filled 2018-10-03: qty 1

## 2018-10-03 MED ORDER — FLEET ENEMA 7-19 GM/118ML RE ENEM: 1.0000 | ENEMA | Freq: Once | RECTAL | Status: DC

## 2018-10-03 MED ORDER — BUPIVACAINE-EPINEPHRINE 0.5% -1:200000 IJ SOLN
INTRAMUSCULAR | Status: DC | PRN
  Administered 2018-10-03: 30 mL

## 2018-10-03 MED ORDER — FENTANYL CITRATE (PF) 100 MCG/2ML IJ SOLN
INTRAMUSCULAR | Status: AC
  Filled 2018-10-03: qty 2

## 2018-10-03 MED ORDER — DEXAMETHASONE SODIUM PHOSPHATE 10 MG/ML IJ SOLN
INTRAMUSCULAR | Status: DC | PRN
  Administered 2018-10-03: 10 mg via INTRAVENOUS

## 2018-10-03 MED ORDER — PROPOFOL 10 MG/ML IV BOLUS
INTRAVENOUS | Status: AC
  Filled 2018-10-03: qty 20

## 2018-10-03 MED ORDER — CHLORHEXIDINE GLUCONATE CLOTH 2 % EX PADS: 6.0000 | MEDICATED_PAD | Freq: Once | CUTANEOUS | Status: DC

## 2018-10-03 MED ORDER — ACETAMINOPHEN 500 MG PO TABS
1000.0000 mg | ORAL_TABLET | Freq: Four times a day (QID) | ORAL | Status: DC | PRN
  Administered 2018-10-03: 1000 mg via ORAL

## 2018-10-03 MED ORDER — ACETAMINOPHEN 500 MG PO TABS
ORAL_TABLET | ORAL | Status: AC
  Administered 2018-10-03: 1000 mg via ORAL
  Filled 2018-10-03: qty 2

## 2018-10-03 MED ORDER — FAMOTIDINE 20 MG PO TABS
20.0000 mg | ORAL_TABLET | Freq: Once | ORAL | Status: AC
  Administered 2018-10-03: 20 mg via ORAL

## 2018-10-03 MED ORDER — GABAPENTIN 300 MG PO CAPS
300.0000 mg | ORAL_CAPSULE | ORAL | Status: AC
  Administered 2018-10-03: 300 mg via ORAL

## 2018-10-03 MED ORDER — FENTANYL CITRATE (PF) 100 MCG/2ML IJ SOLN
INTRAMUSCULAR | Status: DC | PRN
  Administered 2018-10-03 (×2): 50 ug via INTRAVENOUS

## 2018-10-03 MED ORDER — ONDANSETRON HCL 4 MG/2ML IJ SOLN
INTRAMUSCULAR | Status: DC | PRN
  Administered 2018-10-03: 4 mg via INTRAVENOUS

## 2018-10-03 MED ORDER — OXYCODONE HCL 5 MG PO TABS: 5.0000 mg | ORAL_TABLET | ORAL | 0 refills | Status: DC | PRN

## 2018-10-03 MED ORDER — PROPOFOL 10 MG/ML IV BOLUS
INTRAVENOUS | Status: DC | PRN
  Administered 2018-10-03: 180 mg via INTRAVENOUS

## 2018-10-03 MED ORDER — LACTATED RINGERS IV SOLN
INTRAVENOUS | Status: DC
  Administered 2018-10-03: 14:00:00 via INTRAVENOUS

## 2018-10-03 MED ORDER — BUPIVACAINE LIPOSOME 1.3 % IJ SUSP
INTRAMUSCULAR | Status: AC
  Filled 2018-10-03: qty 20

## 2018-10-03 MED ORDER — FAMOTIDINE 20 MG PO TABS
ORAL_TABLET | ORAL | Status: AC
  Administered 2018-10-03: 20 mg via ORAL
  Filled 2018-10-03: qty 1

## 2018-10-03 MED ORDER — MIDAZOLAM HCL 2 MG/2ML IJ SOLN
INTRAMUSCULAR | Status: DC | PRN
  Administered 2018-10-03: 2 mg via INTRAVENOUS

## 2018-10-03 MED ORDER — BUPIVACAINE LIPOSOME 1.3 % IJ SUSP: 20.0000 mL | Freq: Once | INTRAMUSCULAR | Status: DC

## 2018-10-03 MED ORDER — ONDANSETRON HCL 4 MG/2ML IJ SOLN: 4.0000 mg | Freq: Once | INTRAMUSCULAR | Status: DC | PRN

## 2018-10-03 MED ORDER — LIDOCAINE HCL URETHRAL/MUCOSAL 2 % EX GEL
CUTANEOUS | Status: AC
  Filled 2018-10-03: qty 10

## 2018-10-03 MED ORDER — MIDAZOLAM HCL 2 MG/2ML IJ SOLN
INTRAMUSCULAR | Status: AC
  Filled 2018-10-03: qty 2

## 2018-10-03 MED ORDER — FENTANYL CITRATE (PF) 100 MCG/2ML IJ SOLN: 25.0000 ug | INTRAMUSCULAR | Status: DC | PRN

## 2018-10-03 MED ORDER — ACETAMINOPHEN 500 MG PO TABS
1000.0000 mg | ORAL_TABLET | ORAL | Status: AC
  Administered 2018-10-03: 1000 mg via ORAL

## 2018-10-03 MED ORDER — KETAMINE HCL 50 MG/ML IJ SOLN
INTRAMUSCULAR | Status: DC | PRN
  Administered 2018-10-03: 50 mg via INTRAMUSCULAR

## 2018-10-03 MED ORDER — GABAPENTIN 300 MG PO CAPS
ORAL_CAPSULE | ORAL | Status: AC
  Administered 2018-10-03: 300 mg via ORAL
  Filled 2018-10-03: qty 1

## 2018-10-03 MED ORDER — KETAMINE HCL 50 MG/ML IJ SOLN
INTRAMUSCULAR | Status: AC
  Filled 2018-10-03: qty 10

## 2018-10-03 MED ORDER — HYDROGEN PEROXIDE 3 % EX SOLN
CUTANEOUS | Status: DC | PRN
  Administered 2018-10-03: 1

## 2018-10-03 MED ORDER — BUPIVACAINE-EPINEPHRINE (PF) 0.5% -1:200000 IJ SOLN
INTRAMUSCULAR | Status: AC
  Filled 2018-10-03: qty 30

## 2018-10-03 MED ORDER — BUPIVACAINE LIPOSOME 1.3 % IJ SUSP
INTRAMUSCULAR | Status: DC | PRN
  Administered 2018-10-03: 20 mL

## 2018-10-03 SURGICAL SUPPLY — 34 items
BLADE SURG 15 STRL LF DISP TIS (BLADE) IMPLANT
BLADE SURG 15 STRL SS (BLADE) ×3
BRIEF STRETCH MATERNITY 2XLG (MISCELLANEOUS) ×3 IMPLANT
CANISTER SUCT 1200ML W/VALVE (MISCELLANEOUS) ×3 IMPLANT
COVER WAND RF STERILE (DRAPES) ×3 IMPLANT
DRAPE LAPAROTOMY 100X77 ABD (DRAPES) ×3 IMPLANT
DRAPE LEGGINS SURG 28X43 STRL (DRAPES) ×3 IMPLANT
DRAPE UNDER BUTTOCK W/FLU (DRAPES) ×3 IMPLANT
DRSG GAUZE FLUFF 36X18 (GAUZE/BANDAGES/DRESSINGS) ×5 IMPLANT
ELECT REM PT RETURN 9FT ADLT (ELECTROSURGICAL) ×3
ELECTRODE REM PT RTRN 9FT ADLT (ELECTROSURGICAL) ×1 IMPLANT
GLOVE SURG SYN 7.0 (GLOVE) ×3 IMPLANT
GLOVE SURG SYN 7.0 PF PI (GLOVE) ×1 IMPLANT
GLOVE SURG SYN 7.5  E (GLOVE) ×2
GLOVE SURG SYN 7.5 E (GLOVE) ×1 IMPLANT
GLOVE SURG SYN 7.5 PF PI (GLOVE) ×1 IMPLANT
GOWN STRL REUS W/ TWL LRG LVL3 (GOWN DISPOSABLE) ×2 IMPLANT
GOWN STRL REUS W/TWL LRG LVL3 (GOWN DISPOSABLE) ×9
IV CATH ANGIO 14GX1.88 NO SAFE (IV SOLUTION) ×2 IMPLANT
KIT TURNOVER KIT A (KITS) ×3 IMPLANT
LABEL OR SOLS (LABEL) ×3 IMPLANT
LIGASURE IMPACT 36 18CM CVD LR (INSTRUMENTS) IMPLANT
NEEDLE HYPO 22GX1.5 SAFETY (NEEDLE) ×3 IMPLANT
NS IRRIG 500ML POUR BTL (IV SOLUTION) ×3 IMPLANT
PACK BASIN MINOR ARMC (MISCELLANEOUS) ×3 IMPLANT
PAD OB MATERNITY 4.3X12.25 (PERSONAL CARE ITEMS) ×3 IMPLANT
PAD PREP 24X41 OB/GYN DISP (PERSONAL CARE ITEMS) ×3 IMPLANT
SOL PREP PVP 2OZ (MISCELLANEOUS) ×3
SOLUTION PREP PVP 2OZ (MISCELLANEOUS) ×1 IMPLANT
SURGILUBE 2OZ TUBE FLIPTOP (MISCELLANEOUS) ×3 IMPLANT
SUT VIC AB 2-0 SH 27 (SUTURE) ×3
SUT VIC AB 2-0 SH 27XBRD (SUTURE) ×2 IMPLANT
SYR 10ML LL (SYRINGE) ×7 IMPLANT
SYR BULB IRRIG 60ML STRL (SYRINGE) ×1 IMPLANT

## 2018-10-03 NOTE — Anesthesia Procedure Notes (Signed)
Procedure Name: LMA Insertion Date/Time: 10/03/2018 2:09 PM Performed by: Jonna Clark, CRNA Pre-anesthesia Checklist: Patient identified, Patient being monitored, Timeout performed, Emergency Drugs available and Suction available Patient Re-evaluated:Patient Re-evaluated prior to induction Oxygen Delivery Method: Circle system utilized Preoxygenation: Pre-oxygenation with 100% oxygen Induction Type: IV induction Ventilation: Mask ventilation without difficulty LMA: LMA inserted LMA Size: 4.0 Tube type: Oral Number of attempts: 1 Placement Confirmation: positive ETCO2 and breath sounds checked- equal and bilateral Tube secured with: Tape Dental Injury: Teeth and Oropharynx as per pre-operative assessment

## 2018-10-03 NOTE — Anesthesia Post-op Follow-up Note (Signed)
Anesthesia QCDR form completed.        

## 2018-10-03 NOTE — Anesthesia Preprocedure Evaluation (Signed)
Anesthesia Evaluation  Patient identified by MRN, date of birth, ID band Patient awake    Reviewed: Allergy & Precautions, H&P , NPO status , Patient's Chart, lab work & pertinent test results, reviewed documented beta blocker date and time   Airway Mallampati: II  TM Distance: >3 FB Neck ROM: full    Dental  (+) Teeth Intact   Pulmonary neg pulmonary ROS, former smoker,    Pulmonary exam normal        Cardiovascular Exercise Tolerance: Good negative cardio ROS Normal cardiovascular exam Rate:Normal     Neuro/Psych  Headaches, negative psych ROS   GI/Hepatic Neg liver ROS, PUD,   Endo/Other  Hypothyroidism Morbid obesity  Renal/GU negative Renal ROS  negative genitourinary   Musculoskeletal   Abdominal   Peds  Hematology negative hematology ROS (+)   Anesthesia Other Findings   Reproductive/Obstetrics negative OB ROS                             Anesthesia Physical Anesthesia Plan  ASA: III  Anesthesia Plan: General LMA   Post-op Pain Management:    Induction:   PONV Risk Score and Plan:   Airway Management Planned:   Additional Equipment:   Intra-op Plan:   Post-operative Plan:   Informed Consent: I have reviewed the patients History and Physical, chart, labs and discussed the procedure including the risks, benefits and alternatives for the proposed anesthesia with the patient or authorized representative who has indicated his/her understanding and acceptance.     Plan Discussed with: CRNA  Anesthesia Plan Comments:         Anesthesia Quick Evaluation

## 2018-10-03 NOTE — Interval H&P Note (Signed)
History and Physical Interval Note:  10/03/2018 1:33 PM  Jodi Carpenter  has presented today for surgery, with the diagnosis of EXTERNAL HEMORRHOIDS  The various methods of treatment have been discussed with the patient and family. After consideration of risks, benefits and other options for treatment, the patient has consented to  Procedure(s): HEMORRHOIDECTOMY (N/A) EXCISION OF SKIN TAG (N/A) as a surgical intervention .  The patient's history has been reviewed, patient examined, no change in status, stable for surgery.  I have reviewed the patient's chart and labs.  Questions were answered to the patient's satisfaction.     Rogue Pautler

## 2018-10-03 NOTE — Discharge Instructions (Signed)

## 2018-10-03 NOTE — Op Note (Signed)
  Procedure Date:  10/03/2018  Pre-operative Diagnosis:  External hemorrhoid and skin tags  Post-operative Diagnosis:  Anal fissure, fistula in ano, skin tag  Procedure:  Exam under anesthesia  Surgeon:  Melvyn Neth, MD  Assistant:  Sharyn Lull, PA-S  Anesthesia:  General endotracheal  Estimated Blood Loss:  10 ml  Specimens:  None  Complications:  None  Indications for Procedure:  This is a 37 y.o. female with a prior history of ulcerative colitis, s/p total colectomy with IPAA many years ago.  She presents with perianal pain from what looked like external hemorrhoid and skin tags.  However, in the clinic, I could not do a thorough exam due to pain.  Thus, it was decided to bring her to OR for exam under anesthesia, and hemorrhoidectomy and excision of skin tags. The risks of bleeding, abscess or infection, injury to surrounding structures, and need for further procedures were all discussed with the patient and was willing to proceed.  Description of Procedure: The patient was correctly identified in the preoperative area and brought into the operating room.  The patient was placed supine with VTE prophylaxis in place.  Appropriate time-outs were performed.  Anesthesia was induced and the patient was intubated.  She was placed in lithotomy position.  The patient's perianal area was prepped and draped in usual sterile fashion.  We started with an external exam, which revealed external hemorrhoids, an area of induration in the right posterior, and a skin tag superiorly.  On digital exam, she had a tight sphincter tone.  After digital exam to dilate and placement of the bivalve retractor, it was noted that she actually had an anal fissure posteriorly.  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   Given her history, no further procedures were done.  50 ml of Exparel solution was infiltrated around the perianal area and as a pudendal block.  The perianal area was cleaned and dressed with fluff gauze and mesh underwear.  The patient was then placed in flat supine position, emerged from anesthesia, extubated, and brought to the recovery room for further management.    The patient tolerated the procedure well and all counts were correct at the end of the case.   Melvyn Neth, MD

## 2018-10-03 NOTE — Transfer of Care (Signed)
Immediate Anesthesia Transfer of Care Note  Patient: Jodi Carpenter  Procedure(s) Performed: RECTAL EXAM UNDER ANESTHESIA (N/A )  Patient Location: PACU  Anesthesia Type:General  Level of Consciousness: drowsy and responds to stimulation  Airway & Oxygen Therapy: Patient Spontanous Breathing and Patient connected to face mask oxygen  Post-op Assessment: Report given to RN and Post -op Vital signs reviewed and stable  Post vital signs: Reviewed and stable  Last Vitals:  Vitals Value Taken Time  BP 126/68 10/03/2018  3:05 PM  Temp 36.8 C 10/03/2018  3:05 PM  Pulse 112 10/03/2018  3:05 PM  Resp 21 10/03/2018  3:05 PM  SpO2 100 % 10/03/2018  3:05 PM  Vitals shown include unvalidated device data.  Last Pain:  Vitals:   10/03/18 1323  TempSrc: Axillary  PainSc: 0-No pain         Complications: No apparent anesthesia complications

## 2018-10-04 ENCOUNTER — Encounter: Payer: Self-pay | Admitting: Surgery

## 2018-10-10 NOTE — Anesthesia Postprocedure Evaluation (Signed)
Anesthesia Post Note  Patient: Jodi Carpenter  Procedure(s) Performed: RECTAL EXAM UNDER ANESTHESIA (N/A )  Patient location during evaluation: PACU Anesthesia Type: General Level of consciousness: awake and alert Pain management: pain level controlled Vital Signs Assessment: post-procedure vital signs reviewed and stable Respiratory status: spontaneous breathing, nonlabored ventilation, respiratory function stable and patient connected to nasal cannula oxygen Cardiovascular status: blood pressure returned to baseline and stable Postop Assessment: no apparent nausea or vomiting Anesthetic complications: no     Last Vitals:  Vitals:   10/03/18 1550 10/03/18 1553  BP:  (!) 128/55  Pulse: 94 72  Resp: 20 15  Temp:  (!) 36.3 C  SpO2: 98% 100%    Last Pain:  Vitals:   10/04/18 0838  TempSrc:   PainSc: 0-No pain                 Molli Barrows

## 2018-10-18 ENCOUNTER — Ambulatory Visit (INDEPENDENT_AMBULATORY_CARE_PROVIDER_SITE_OTHER): Payer: BLUE CROSS/BLUE SHIELD | Admitting: Certified Nurse Midwife

## 2018-10-18 ENCOUNTER — Encounter: Payer: Self-pay | Admitting: Certified Nurse Midwife

## 2018-10-18 VITALS — BP 110/80 | HR 90 | Ht 65.0 in | Wt 237.0 lb

## 2018-10-18 DIAGNOSIS — Z30431 Encounter for routine checking of intrauterine contraceptive device: Secondary | ICD-10-CM | POA: Diagnosis not present

## 2018-10-18 DIAGNOSIS — Z309 Encounter for contraceptive management, unspecified: Secondary | ICD-10-CM | POA: Insufficient documentation

## 2018-10-18 NOTE — Progress Notes (Signed)
  History of Present Illness:  Jodi Carpenter is a 37 y.o. that had a Mirena IUD placed approximately 1 month ago. Since that time, she states that she has had light spotting intermittently. Denies abdominal pain.  PMHx: She  has a past medical history of Adenomyosis (03/12/2015), Anxiety, Colitis, Depression, Headache, Hypothyroidism, Infertility, female, Obesity (BMI 35.0-39.9 without comorbidity), and Thyroid disease (2012). Also,  has a past surgical history that includes Mass excision; Tonsillectomy; Other surgical history; Left colectomy (05/23/2010); and Rectal exam under anesthesia (N/A, 10/03/2018)., family history includes Diverticulosis in her father; Hypertension in her father and mother; Panic disorder in her mother; Prostate cancer in her maternal grandfather.,  reports that she quit smoking about 14 years ago. Her smoking use included cigarettes. She has a 2.00 pack-year smoking history. She has never used smokeless tobacco. She reports that she drinks alcohol. She reports that she does not use drugs.  She has a current medication list which includes the following prescription(s): clindamycin, levonorgestrel, levothyroxine, multiple vitamins-minerals, and spironolactone. Also, is allergic to cortisporin  [neomycin-polymyxin-hc].  ROS  Physical Exam:  BP 110/80   Pulse 90   Ht 5\' 5"  (1.651 m)   Wt 237 lb (107.5 kg)   LMP 09/26/2018 (Approximate)   BMI 39.44 kg/m  Body mass index is 39.44 kg/m. Constitutional: Well nourished, well developed female in no acute distress.  Neuro: Grossly intact Psych:  Normal mood and affect.    Pelvic exam: External/BUS: no lesion, no discharge Vagina: no lesions, small amount blood present Cervix: Two IUD strings present just inside  the cervical os.   Assessment: IUD strings present in proper location; pt doing well  Plan: Discussed normal bleeding pattern after IUD insertion Will see her back in 1 year for annual and prn  problems.  Dalia Heading, CNM

## 2018-11-08 DIAGNOSIS — K51219 Ulcerative (chronic) proctitis with unspecified complications: Secondary | ICD-10-CM | POA: Insufficient documentation

## 2019-01-08 ENCOUNTER — Telehealth: Payer: Self-pay | Admitting: Family Medicine

## 2019-01-08 MED ORDER — OSELTAMIVIR PHOSPHATE 75 MG PO CAPS: 75.0000 mg | ORAL_CAPSULE | Freq: Two times a day (BID) | ORAL | 0 refills | Status: AC

## 2019-01-08 NOTE — Telephone Encounter (Signed)
Please advise 

## 2019-01-08 NOTE — Telephone Encounter (Signed)
Pt called saying that her mother Callie Fielding came in on Monday and was diagnosed with the flu.  She now has the same symptoms and wants to know if you will send Tamiflu to [facility] in [place] for her  Pt's CB# [phone]  Thanks teri

## 2019-01-08 NOTE — Telephone Encounter (Signed)
ok 

## 2019-01-08 NOTE — Telephone Encounter (Signed)
Medication was sent into the pharmacy.  

## 2019-04-01 ENCOUNTER — Telehealth: Payer: Self-pay

## 2019-04-01 NOTE — Telephone Encounter (Signed)
Patient called requesting a refill on Xanax 0.5mg  1 tablet twice daily as needed. This medication is not listed in patient's chart. She says Dr. Rosanna Randy has prescribed this medication for her for several years. It looks like this medication was discontinued by a CMA from a different office named Cleophas Dunker. Pharmacy: [facility] .

## 2019-04-02 NOTE — Telephone Encounter (Signed)
#  [id number].

## 2019-04-02 NOTE — Telephone Encounter (Signed)
Ordered please send in. Thanks!

## 2019-04-03 ENCOUNTER — Telehealth: Payer: Self-pay

## 2019-04-03 NOTE — Telephone Encounter (Signed)
Patient called requesting Xanax be refilled. Said she called earlier in the week but the  Rx has not been refilled

## 2019-04-04 MED ORDER — ALPRAZOLAM 0.5 MG PO TABS: 0.5000 mg | ORAL_TABLET | Freq: Two times a day (BID) | ORAL | 1 refills | Status: DC | PRN

## 2019-04-04 NOTE — Telephone Encounter (Signed)
Sent in

## 2019-04-04 NOTE — Telephone Encounter (Signed)
Please review. Xanax in your box to refill. Thanks!

## 2019-07-11 ENCOUNTER — Telehealth: Payer: Self-pay

## 2019-07-11 ENCOUNTER — Other Ambulatory Visit: Payer: Self-pay | Admitting: Family Medicine

## 2019-07-11 DIAGNOSIS — E038 Other specified hypothyroidism: Secondary | ICD-10-CM

## 2019-07-11 DIAGNOSIS — E039 Hypothyroidism, unspecified: Secondary | ICD-10-CM

## 2019-07-11 NOTE — Telephone Encounter (Signed)
Patient would like to have labs done prior to her appointment on 07/22/2019.

## 2019-07-14 NOTE — Telephone Encounter (Signed)
Please review. Which labs would you like to order?

## 2019-07-17 NOTE — Telephone Encounter (Signed)
Lab ordered and pt advised.

## 2019-07-17 NOTE — Telephone Encounter (Signed)
TSH only

## 2019-07-21 ENCOUNTER — Telehealth: Payer: Self-pay

## 2019-07-21 DIAGNOSIS — E039 Hypothyroidism, unspecified: Secondary | ICD-10-CM

## 2019-07-21 NOTE — Telephone Encounter (Signed)
Pt is requesting her lab slip for TSH be sent to the [facility] on [address]. Please advise. Thanks TNP

## 2019-07-21 NOTE — Telephone Encounter (Signed)
yes

## 2019-07-21 NOTE — Telephone Encounter (Signed)
Ok to order 

## 2019-07-21 NOTE — Telephone Encounter (Signed)
Order has been placed. L/M advising patient.

## 2019-07-22 ENCOUNTER — Ambulatory Visit: Payer: Self-pay | Admitting: Family Medicine

## 2019-07-22 ENCOUNTER — Other Ambulatory Visit: Payer: Self-pay

## 2019-07-22 ENCOUNTER — Ambulatory Visit (INDEPENDENT_AMBULATORY_CARE_PROVIDER_SITE_OTHER): Payer: Self-pay | Admitting: Family Medicine

## 2019-07-22 DIAGNOSIS — E039 Hypothyroidism, unspecified: Secondary | ICD-10-CM

## 2019-07-22 NOTE — Progress Notes (Signed)
       Patient: Jodi Carpenter Female    DOB: 1980/12/31   38 y.o.   MRN: [id number] Visit Date: 07/22/2019  Today's Provider: Wilhemena Durie, MD   Given the they were Subjective:    Virtual Visit via Telephone Note  I connected with Jodi Carpenter on 07/22/19 at  8:20 AM EDT by telephone and verified that I am speaking with the correct person using two identifiers.   I discussed the limitations, risks, security and privacy concerns of performing an evaluation and management service by telephone and the availability of in person appointments. I also discussed with the patient that there may be a patient responsible charge related to this service. The patient expressed understanding and agreed to proceed.   History of Present Illness:   For encounter patient with long history of hypothyroidism.  She feels sluggish and feels as though she should be losing some weight as she has been exercising a lot. Observations/Objective:     Allergies  Allergen Reactions  . Cortisporin  [Neomycin-Polymyxin-Hc] Swelling     Current Outpatient Medications:  .  ALPRAZolam (XANAX) 0.5 MG tablet, Take 1 tablet (0.5 mg total) by mouth 2 (two) times daily as needed for anxiety., Disp: 45 tablet, Rfl: 1 .  clindamycin (CLEOCIN T) 1 % lotion, Apply 1 application topically daily., Disp: , Rfl:  .  levonorgestrel (MIRENA) 20 MCG/24HR IUD, 1 each by Intrauterine route once., Disp: , Rfl:  .  levothyroxine (SYNTHROID) 125 MCG tablet, TAKE 1 TABLET (125 MCG TOTAL) BY MOUTH DAILY BEFORE BREAKFAST., Disp: 90 tablet, Rfl: 1 .  Multiple Vitamins-Minerals (MULTIVITAMIN ADULT PO), Take 1 tablet by mouth daily., Disp: , Rfl:  .  spironolactone (ALDACTONE) 100 MG tablet, Take 1 tablet by mouth daily., Disp: , Rfl: 2  Review of Systems  Social History   Tobacco Use  . Smoking status: Former Smoker    Packs/day: 0.25    Years: 8.00    Pack years: 2.00    Types: Cigarettes    Quit date:  09/27/2004    Years since quitting: 14.8  . Smokeless tobacco: Never Used  Substance Use Topics  . Alcohol use: Yes    Comment: 1-2 a month      Objective:   There were no vitals taken for this visit. There were no vitals filed for this visit.   Physical Exam   No results found for any visits on 07/22/19.     Assessment & Plan    1. Adult hypothyroidism TSH pending.  Follow Up Instructions:    I discussed the assessment and treatment plan with the patient. The patient was provided an opportunity to ask questions and all were answered. The patient agreed with the plan and demonstrated an understanding of the instructions.   The patient was advised to call back or seek an in-person evaluation if the symptoms worsen or if the condition fails to improve as anticipated.  I provided 8 minutes of non-face-to-face time during this encounter.      Richard Cranford Mon, MD  [facility]

## 2019-07-23 LAB — TSH: TSH: 2.19 u[IU]/mL (ref 0.450–4.500)

## 2019-11-26 ENCOUNTER — Other Ambulatory Visit: Payer: Self-pay

## 2019-11-26 ENCOUNTER — Ambulatory Visit (INDEPENDENT_AMBULATORY_CARE_PROVIDER_SITE_OTHER): Payer: Managed Care, Other (non HMO) | Admitting: Family Medicine

## 2019-11-26 ENCOUNTER — Encounter: Payer: Self-pay | Admitting: Family Medicine

## 2019-11-26 VITALS — BP 123/71 | HR 84 | Temp 97.1°F | Resp 18 | Ht 65.0 in | Wt 218.0 lb

## 2019-11-26 DIAGNOSIS — Z23 Encounter for immunization: Secondary | ICD-10-CM | POA: Diagnosis not present

## 2019-11-26 DIAGNOSIS — F419 Anxiety disorder, unspecified: Secondary | ICD-10-CM | POA: Diagnosis not present

## 2019-11-26 DIAGNOSIS — E039 Hypothyroidism, unspecified: Secondary | ICD-10-CM | POA: Diagnosis not present

## 2019-11-26 DIAGNOSIS — Z0000 Encounter for general adult medical examination without abnormal findings: Secondary | ICD-10-CM

## 2019-11-26 LAB — POCT URINALYSIS DIPSTICK
Bilirubin, UA: NEGATIVE
Blood, UA: NEGATIVE
Glucose, UA: NEGATIVE
Ketones, UA: NEGATIVE
Leukocytes, UA: NEGATIVE
Nitrite, UA: NEGATIVE
Protein, UA: NEGATIVE
Spec Grav, UA: 1.025 (ref 1.010–1.025)
Urobilinogen, UA: 0.2 E.U./dL
pH, UA: 6 (ref 5.0–8.0)

## 2019-11-26 MED ORDER — ALPRAZOLAM 0.5 MG PO TABS: 0.5000 mg | ORAL_TABLET | Freq: Two times a day (BID) | ORAL | 2 refills | Status: DC | PRN

## 2019-11-26 NOTE — Progress Notes (Signed)
Patient: Jodi Carpenter, Female    DOB: 02/15/1981, 38 y.o.   MRN: [id number] Visit Date: 11/26/2019  Today's Provider: Wilhemena Durie, MD   Chief Complaint  Patient presents with  . Annual Exam   Subjective:     Annual physical exam Jodi Carpenter is a 38 y.o. female who presents today for health maintenance and complete physical. She feels well. She reports exercising. She reports she is sleeping well. She sees Gyn yearly. ----------------------------------------------------------------- Last PAP: 03/2017 -  Colonoscopy: 02/2008   Review of Systems  Constitutional: Negative.   HENT: Negative.   Eyes: Negative.   Respiratory: Negative.   Cardiovascular: Negative.   Endocrine: Negative.   Genitourinary: Negative.   Musculoskeletal: Negative.   Skin: Negative.   Allergic/Immunologic: Positive for immunocompromised state.  Neurological: Negative.   Hematological: Negative.   Psychiatric/Behavioral: The patient is nervous/anxious.     Social History      She  reports that she quit smoking about 15 years ago. Her smoking use included cigarettes. She has a 2.00 pack-year smoking history. She has never used smokeless tobacco. She reports current alcohol use. She reports that she does not use drugs.       Social History   Socioeconomic History  . Marital status: Divorced    Spouse name: Not on file  . Number of children: 0  . Years of education: 67  . Highest education level: Not on file  Occupational History  . Occupation: Tree surgeon  Tobacco Use  . Smoking status: Former Smoker    Packs/day: 0.25    Years: 8.00    Pack years: 2.00    Types: Cigarettes    Quit date: 09/27/2004    Years since quitting: 15.1  . Smokeless tobacco: Never Used  Substance and Sexual Activity  . Alcohol use: Yes    Comment: 1-2 a month  . Drug use: No  . Sexual activity: Yes    Birth control/protection: I.U.D.  Other Topics Concern  . Not on file   Social History Narrative  . Not on file   Social Determinants of Health   Financial Resource Strain:   . Difficulty of Paying Living Expenses: Not on file  Food Insecurity:   . Worried About Charity fundraiser in the Last Year: Not on file  . Ran Out of Food in the Last Year: Not on file  Transportation Needs:   . Lack of Transportation (Medical): Not on file  . Lack of Transportation (Non-Medical): Not on file  Physical Activity:   . Days of Exercise per Week: Not on file  . Minutes of Exercise per Session: Not on file  Stress:   . Feeling of Stress : Not on file  Social Connections:   . Frequency of Communication with Friends and Family: Not on file  . Frequency of Social Gatherings with Friends and Family: Not on file  . Attends Religious Services: Not on file  . Active Member of Clubs or Organizations: Not on file  . Attends Archivist Meetings: Not on file  . Marital Status: Not on file    Past Medical History:  Diagnosis Date  . Adenomyosis 03/12/2015  . Anxiety   . Colitis   . Depression   . Headache   . Hypothyroidism   . Infertility, female   . Obesity (BMI 35.0-39.9 without comorbidity)   . Thyroid disease 2012   Hashimoto's thyroiditis/ hypothyroidism     Patient  Active Problem List   Diagnosis Date Noted  . 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 Contraception management 10/18/2018  . Fistula-in-ano   . Anal fissure   . Hemorrhoidal skin tags   . Ulcerative colitis ([place]) 04/24/2017  . Hashimoto's thyroiditis 04/24/2017  . Anxiety 06/22/2015  . 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 Adult hypothyroidism 06/22/2015  . Menorrhagia with regular cycle 06/22/2015  . Adiposity 06/22/2015    Past Surgical History:  Procedure Laterality Date  . LEFT COLECTOMY  05/23/2010   sigmoid colectomy with J pouch  . MASS EXCISION     pilonidal cyst and abscess  . OTHER SURGICAL HISTORY     illeionidal anal stenosis   . RECTAL  EXAM UNDER ANESTHESIA N/A 10/03/2018   Procedure: RECTAL EXAM UNDER ANESTHESIA;  Surgeon: Olean Ree, MD;  Location: [facility] ORS;  Service: General;  Laterality: N/A;  . TONSILLECTOMY      Family History        Family Status  Relation Name Status  . Mother  Alive  . Father  Alive  . Sister  Alive  . Sister  Alive  . MGF  (Not Specified)        Her family history includes Diverticulosis in her father; Hypertension in her father and mother; Panic disorder in her mother; Prostate cancer in her maternal grandfather.      Allergies  Allergen Reactions  . Cortisporin  [Neomycin-Polymyxin-Hc] Swelling     Current Outpatient Medications:  .  ALPRAZolam (XANAX) 0.5 MG tablet, Take 1 tablet (0.5 mg total) by mouth 2 (two) times daily as needed for anxiety., Disp: 45 tablet, Rfl: 1 .  clindamycin (CLEOCIN-T) 1 % lotion, Apply topically 2 (two) times daily., Disp: , Rfl:  .  levonorgestrel (MIRENA) 20 MCG/24HR IUD, 1 each by Intrauterine route once., Disp: , Rfl:  .  levothyroxine (SYNTHROID) 125 MCG tablet, TAKE 1 TABLET (125 MCG TOTAL) BY MOUTH DAILY BEFORE BREAKFAST., Disp: 90 tablet, Rfl: 1 .  Multiple Vitamins-Minerals (MULTIVITAMIN ADULT PO), Take 1 tablet by mouth daily., Disp: , Rfl:    Patient Care Team: Jerrol Banana., MD as PCP - General (Family Medicine)    Objective:    Vitals: BP 123/71   Pulse 84   Temp (!) 97.1 F (36.2 C) (Temporal)   Resp 18   Ht 5' 5"  (1.651 m)   Wt 218 lb (98.9 kg)   LMP 11/18/2019 (Within Days)   SpO2 98%   BMI 36.28 kg/m    Vitals:   11/26/19 0849  BP: 123/71  Pulse: 84  Resp: 18  Temp: (!) 97.1 F (36.2 C)  TempSrc: Temporal  SpO2: 98%  Weight: 218 lb (98.9 kg)  Height: 5' 5"  (1.651 m)     Physical Exam Vitals reviewed.  Constitutional:      Appearance: She is obese.  HENT:     Head: Normocephalic and atraumatic.     Right Ear: External ear normal.     Left Ear: External ear normal.  Eyes:     General: No  scleral icterus.    Conjunctiva/sclera: Conjunctivae normal.  Cardiovascular:     Rate and Rhythm: Normal rate and regular rhythm.     Pulses: Normal pulses.     Heart sounds: Normal heart sounds.  Pulmonary:     Effort: Pulmonary effort is normal.     Breath sounds: Normal breath sounds.  Abdominal:     Palpations: Abdomen is soft.  Lymphadenopathy:  Cervical: No cervical adenopathy.  Skin:    General: Skin is warm and dry.  Neurological:     General: No focal deficit present.     Mental Status: She is alert and oriented to person, place, and time.  Psychiatric:        Mood and Affect: Mood normal.        Behavior: Behavior normal.        Thought Content: Thought content normal.        Judgment: Judgment normal.      Depression Screen PHQ 2/9 Scores 11/26/2019 05/08/2017  PHQ - 2 Score 0 0  PHQ- 9 Score 0 6       Assessment & Plan:     Routine Health Maintenance and Physical Exam  Exercise Activities and Dietary recommendations Goals   None     Immunization History  Administered Date(s) Administered  . Hepatitis B 01/08/1997, 06/16/1997  . Influenza,inj,Quad PF,6+ Mos 09/09/2018  . Td 01/08/1997, 05/19/1999    Health Maintenance  Topic Date Due  . HIV Screening  12/02/1995  . TETANUS/TDAP  05/18/2009  . INFLUENZA VACCINE  06/28/2019  . PAP SMEAR-Modifier  09/18/2021     Discussed health benefits of physical activity, and encouraged her to engage in regular exercise appropriate for her age and condition.    -------------------------------------------------------------------- 1. Encounter for annual physical exam Well woman per Gyn. - POCT Urinalysis Dipstick - CBC with Diff - Comp Met (CMET) - Lipid panel - TSH  2. Adult hypothyroidism  - TSH  3. Anxiety Refilled Xanax 0.5 mg 2 RF Advised to limit use.  Richard Cranford Mon, MD  [facility]

## 2019-12-04 ENCOUNTER — Encounter: Payer: Self-pay | Admitting: *Deleted

## 2019-12-04 LAB — COMPREHENSIVE METABOLIC PANEL
ALT: 34 IU/L — ABNORMAL HIGH (ref 0–32)
AST: 28 IU/L (ref 0–40)
Albumin/Globulin Ratio: 1.8 (ref 1.2–2.2)
Albumin: 4.6 g/dL (ref 3.8–4.8)
Alkaline Phosphatase: 80 IU/L (ref 39–117)
BUN/Creatinine Ratio: 19 (ref 9–23)
BUN: 17 mg/dL (ref 6–20)
Bilirubin Total: 0.3 mg/dL (ref 0.0–1.2)
CO2: 23 mmol/L (ref 20–29)
Calcium: 9.6 mg/dL (ref 8.7–10.2)
Chloride: 102 mmol/L (ref 96–106)
Creatinine, Ser: 0.88 mg/dL (ref 0.57–1.00)
GFR calc Af Amer: 96 mL/min/{1.73_m2} (ref >59)
GFR calc non Af Amer: 83 mL/min/{1.73_m2} (ref >59)
Globulin, Total: 2.6 g/dL (ref 1.5–4.5)
Glucose: 97 mg/dL (ref 65–99)
Potassium: 5 mmol/L (ref 3.5–5.2)
Sodium: 139 mmol/L (ref 134–144)
Total Protein: 7.2 g/dL (ref 6.0–8.5)

## 2019-12-04 LAB — CBC WITH DIFFERENTIAL/PLATELET
Basophils Absolute: 0.1 10*3/uL (ref 0.0–0.2)
Basos: 1 %
EOS (ABSOLUTE): 0.2 10*3/uL (ref 0.0–0.4)
Eos: 2 %
Hematocrit: 43.6 % (ref 34.0–46.6)
Hemoglobin: 14.9 g/dL (ref 11.1–15.9)
Immature Grans (Abs): 0 10*3/uL (ref 0.0–0.1)
Immature Granulocytes: 0 %
Lymphocytes Absolute: 1.7 10*3/uL (ref 0.7–3.1)
Lymphs: 17 %
MCH: 30.6 pg (ref 26.6–33.0)
MCHC: 34.2 g/dL (ref 31.5–35.7)
MCV: 90 fL (ref 79–97)
Monocytes Absolute: 0.7 10*3/uL (ref 0.1–0.9)
Monocytes: 6 %
Neutrophils Absolute: 7.6 10*3/uL — ABNORMAL HIGH (ref 1.4–7.0)
Neutrophils: 74 %
Platelets: 268 10*3/uL (ref 150–450)
RBC: 4.87 x10E6/uL (ref 3.77–5.28)
RDW: 12.2 % (ref 11.7–15.4)
WBC: 10.2 10*3/uL (ref 3.4–10.8)

## 2019-12-04 LAB — LIPID PANEL
Chol/HDL Ratio: 3.2 ratio (ref 0.0–4.4)
Cholesterol, Total: 174 mg/dL (ref 100–199)
HDL: 54 mg/dL (ref >39)
LDL Chol Calc (NIH): 94 mg/dL (ref 0–99)
Triglycerides: 152 mg/dL — ABNORMAL HIGH (ref 0–149)
VLDL Cholesterol Cal: 26 mg/dL (ref 5–40)

## 2019-12-04 LAB — TSH: TSH: 2.67 u[IU]/mL (ref 0.450–4.500)

## 2019-12-21 NOTE — Progress Notes (Signed)
Gynecology Annual Exam  PCP: Jerrol Banana., MD  Chief Complaint:  Chief Complaint  Patient presents with  . Gynecologic Exam    History of Present Illness:Jodi Carpenter presents today for her annual exam. She is a 39 year old Caucasian/White female, G0 P0000, whose LMP is 12/12/2019.Marland Kitchen She had a Mirena inserted 08/2018 for menorrhagia and reports that her menses are not as heavy or painful. Her menses are coming every month, last 5-7 days, and are moderate in flow She usually does not have cramping. Occasionally has intermenstrual spotting. The patient's past medical history is remarkable for ulcerative colitis, Hashimoto's thyroiditis/ hypothyroidism, obesity, and infertility. She has had an extensive infertility work up. She is currently divorced and is not wishing to conceive at this time.  Since her last annual GYN exam 09/18/2018, she has lost 19# with diet and exercise. She is sexually active and reports no problems with intercourse. She is currently uses condoms for contraception. Her last ultrasound in 2016 was c/w adenomyosis  Her most recent Pap smear was 09/18/2018  and was NIL . No history of abnormal Pap smears  Mammogram is not applicable. There is no family history of breast cancer The patient does not do monthly self breast exams.  The patient does not smoke The patient does drink 2 drinks/month. The patient does not use drugs The patient does exercise regularly 5-7x/week. She may get adequate calcium in her diet.  Patient reports no symptoms of concern as it pertains to depression.       Review of Systems: Review of Systems  Constitutional: Negative for chills, fever, malaise/fatigue and weight loss.  HENT: Negative for congestion, sinus pain and sore throat.   Eyes: Negative for blurred vision and pain.  Respiratory: Negative for cough, hemoptysis, shortness of breath and wheezing.   Cardiovascular: Negative for chest pain, palpitations and leg  swelling.  Gastrointestinal: Negative for abdominal pain, blood in stool, diarrhea (after sigmoid colectomy), heartburn, nausea and vomiting.  Genitourinary: Negative for dysuria, frequency, hematuria and urgency.  Musculoskeletal: Negative for back pain, joint pain and myalgias.  Skin: Negative for itching and rash.  Neurological: Negative for dizziness, tingling and headaches.  Endo/Heme/Allergies: Positive for environmental allergies (and sneezing). Negative for polydipsia. Does not bruise/bleed easily.       Negative for hirsutism   Psychiatric/Behavioral: Negative for depression. The patient is not nervous/anxious and does not have insomnia.     Past Medical History:  Past Medical History:  Diagnosis Date  . Adenomyosis 03/12/2015  . Anxiety   . Colitis   . Depression   . Headache   . Hypothyroidism   . Infertility, female   . Obesity (BMI 35.0-39.9 without comorbidity)   . Thyroid disease 2012   Hashimoto's thyroiditis/ hypothyroidism    Past Surgical History:  Past Surgical History:  Procedure Laterality Date  . LEFT COLECTOMY  05/23/2010   sigmoid colectomy with J pouch  . MASS EXCISION     pilonidal cyst and abscess  . OTHER SURGICAL HISTORY     illeionidal anal stenosis   . RECTAL EXAM UNDER ANESTHESIA N/A 10/03/2018   Procedure: RECTAL EXAM UNDER ANESTHESIA;  Surgeon: Olean Ree, MD;  Location: [facility] ORS;  Service: General;  Laterality: N/A;  . TONSILLECTOMY      Family History:  Family History  Problem Relation Age of Onset  . Hypertension Mother   . Panic disorder Mother   . Hypertension Father   . Diverticulosis Father   .  Prostate cancer Maternal Grandfather     Social History:  Social History   Socioeconomic History  . Marital status: Divorced    Spouse name: Not on file  . Number of children: 0  . Years of education: 84  . Highest education level: Not on file  Occupational History  . Occupation: Tree surgeon  Tobacco Use  .  Smoking status: Former Smoker    Packs/day: 0.25    Years: 8.00    Pack years: 2.00    Types: Cigarettes    Quit date: 09/27/2004    Years since quitting: 15.2  . Smokeless tobacco: Never Used  Substance and Sexual Activity  . Alcohol use: Yes    Comment: 1-2 a month  . Drug use: No  . Sexual activity: Yes    Birth control/protection: I.U.D.  Other Topics Concern  . Not on file  Social History Narrative  . Not on file   Social Determinants of Health   Financial Resource Strain:   . Difficulty of Paying Living Expenses: Not on file  Food Insecurity:   . Worried About Charity fundraiser in the Last Year: Not on file  . Ran Out of Food in the Last Year: Not on file  Transportation Needs:   . Lack of Transportation (Medical): Not on file  . Lack of Transportation (Non-Medical): Not on file  Physical Activity:   . Days of Exercise per Week: Not on file  . Minutes of Exercise per Session: Not on file  Stress:   . Feeling of Stress : Not on file  Social Connections:   . Frequency of Communication with Friends and Family: Not on file  . Frequency of Social Gatherings with Friends and Family: Not on file  . Attends Religious Services: Not on file  . Active Member of Clubs or Organizations: Not on file  . Attends Archivist Meetings: Not on file  . Marital Status: Not on file  Intimate Partner Violence:   . Fear of Current or Ex-Partner: Not on file  . Emotionally Abused: Not on file  . Physically Abused: Not on file  . Sexually Abused: Not on file    Allergies:  Allergies  Allergen Reactions  . Cortisporin  [Neomycin-Polymyxin-Hc] Swelling    Medications: Current Outpatient Medications on File Prior to Visit  Medication Sig Dispense Refill  . ALPRAZolam (XANAX) 0.5 MG tablet Take 1 tablet (0.5 mg total) by mouth 2 (two) times daily as needed for anxiety. 45 tablet 2  . clindamycin (CLEOCIN-T) 1 % lotion Apply topically 2 (two) times daily.    Marland Kitchen  levonorgestrel (MIRENA) 20 MCG/24HR IUD 1 each by Intrauterine route once.    Marland Kitchen levothyroxine (SYNTHROID) 125 MCG tablet TAKE 1 TABLET (125 MCG TOTAL) BY MOUTH DAILY BEFORE BREAKFAST. 90 tablet 1  . Multiple Vitamins-Minerals (MULTIVITAMIN ADULT PO) Take 1 tablet by mouth daily.     No current facility-administered medications on file prior to visit.   Physical Exam Vitals: BP 120/80   Pulse 70   Temp (!) 96.7 F (35.9 C)   Ht 5\' 5"  (1.651 m)   Wt 217 lb (98.4 kg)   LMP 12/12/2019 (Approximate)   BMI 36.11 kg/m General: pleasant WF in NAD HEENT: normocephalic, anicteric Neck: no thyroid enlargement, no palpable nodules, no cervical lymphadenopathy  Pulmonary: No increased work of breathing, CTAB Cardiovascular: RRR, without murmur  Breast: Breast symmetrical, no tenderness, no palpable nodules or masses, no skin or nipple retraction present, no nipple  discharge.  No axillary, infraclavicular or supraclavicular lymphadenopathy. Abdomen: Soft, non-tender, non-distended.  Umbilicus without lesions.  No hepatomegaly or masses palpable. No evidence of hernia. Genitourinary:  External: Normal external female genitalia.  Normal urethral meatus, normal Bartholin's and Skene's glands.    Vagina: Normal vaginal mucosa, no evidence of prolapse.    Cervix: Grossly normal in appearance,  non-tender, IUD strings not visible  Uterus: Anteverted, globular, 8 week size, mobile, and non-tender  Adnexa: No adnexal masses, non-tender  Rectal: deferred  Lymphatic: no evidence of inguinal lymphadenopathy Extremities: no edema, erythema, or tenderness Neurologic: Grossly intact Psychiatric: mood appropriate, affect full     Assessment: 39 y.o. G0P0000 well woman exam Missing IUD strings  Plan:    1) Breast cancer screening - recommend monthly self breast exam. Recommend starting annual mammograms at age 69 2) Cervical cancer screening - Pap was done. ASCCP guidelines and rational discussed.   Patient opts for yearly screening interval 3) Contraception -Mirena IUD, strings are not visible or palpable. Will schedule for pelvic ultrasound for IUD location. 4) Routine healthcare maintenance including cholesterol and diabetes screening managed by PCP.     Dalia Heading, CNM

## 2019-12-22 ENCOUNTER — Encounter: Payer: Self-pay | Admitting: Certified Nurse Midwife

## 2019-12-22 ENCOUNTER — Other Ambulatory Visit: Payer: Self-pay

## 2019-12-22 ENCOUNTER — Other Ambulatory Visit (HOSPITAL_COMMUNITY)
Admission: RE | Admit: 2019-12-22 | Discharge: 2019-12-22 | Disposition: A | Discharge status: Home / Self Care | Payer: Managed Care, Other (non HMO) | Source: Ambulatory Visit | Attending: Certified Nurse Midwife | Admitting: Certified Nurse Midwife

## 2019-12-22 ENCOUNTER — Ambulatory Visit (INDEPENDENT_AMBULATORY_CARE_PROVIDER_SITE_OTHER): Payer: Managed Care, Other (non HMO) | Admitting: Certified Nurse Midwife

## 2019-12-22 VITALS — BP 120/80 | HR 70 | Temp 96.7°F | Ht 65.0 in | Wt 217.0 lb

## 2019-12-22 DIAGNOSIS — T8332XA Displacement of intrauterine contraceptive device, initial encounter: Secondary | ICD-10-CM

## 2019-12-22 DIAGNOSIS — Z1239 Encounter for other screening for malignant neoplasm of breast: Secondary | ICD-10-CM

## 2019-12-22 DIAGNOSIS — Z124 Encounter for screening for malignant neoplasm of cervix: Secondary | ICD-10-CM

## 2019-12-22 DIAGNOSIS — Z01419 Encounter for gynecological examination (general) (routine) without abnormal findings: Secondary | ICD-10-CM | POA: Diagnosis not present

## 2019-12-26 LAB — CYTOLOGY - PAP
Comment: NEGATIVE
High risk HPV: NEGATIVE

## 2019-12-28 ENCOUNTER — Encounter: Payer: Self-pay | Admitting: Certified Nurse Midwife

## 2020-01-08 ENCOUNTER — Ambulatory Visit (INDEPENDENT_AMBULATORY_CARE_PROVIDER_SITE_OTHER): Payer: Managed Care, Other (non HMO)

## 2020-01-08 ENCOUNTER — Ambulatory Visit (INDEPENDENT_AMBULATORY_CARE_PROVIDER_SITE_OTHER): Payer: Managed Care, Other (non HMO) | Admitting: Certified Nurse Midwife

## 2020-01-08 ENCOUNTER — Other Ambulatory Visit: Payer: Self-pay

## 2020-01-08 ENCOUNTER — Encounter: Payer: Self-pay | Admitting: Certified Nurse Midwife

## 2020-01-08 VITALS — BP 100/80 | HR 79 | Temp 97.7°F | Ht 65.0 in | Wt 211.0 lb

## 2020-01-08 DIAGNOSIS — T8332XA Displacement of intrauterine contraceptive device, initial encounter: Secondary | ICD-10-CM

## 2020-01-08 DIAGNOSIS — N938 Other specified abnormal uterine and vaginal bleeding: Secondary | ICD-10-CM | POA: Diagnosis not present

## 2020-01-08 DIAGNOSIS — D252 Subserosal leiomyoma of uterus: Secondary | ICD-10-CM | POA: Diagnosis not present

## 2020-01-08 DIAGNOSIS — N888 Other specified noninflammatory disorders of cervix uteri: Secondary | ICD-10-CM

## 2020-01-08 DIAGNOSIS — N7011 Chronic salpingitis: Secondary | ICD-10-CM

## 2020-01-08 DIAGNOSIS — N83291 Other ovarian cyst, right side: Secondary | ICD-10-CM

## 2020-01-08 DIAGNOSIS — N83292 Other ovarian cyst, left side: Secondary | ICD-10-CM

## 2020-01-08 DIAGNOSIS — N83299 Other ovarian cyst, unspecified side: Secondary | ICD-10-CM

## 2020-01-08 DIAGNOSIS — N920 Excessive and frequent menstruation with regular cycle: Secondary | ICD-10-CM

## 2020-01-10 NOTE — Progress Notes (Addendum)
  [id number] Jodi Carpenter  is a 39 year old Caucasian/White female, G0 P0000, whose LMP is 12/12/2019.Marland Kitchen She had a Mirena inserted 08/2018 for menorrhagia and reports that her menses are not as heavy or painful since her Mirena was inserted, but in the last 2-3 months her flow as well as cramping has increased somewhat and she has had IM spotting. At the time of her annual exam 1/25 her strings were not visible at the cervix. She presented today for an ultrasound for IUD location. Previous ultrasound was suspicious for adenomyosis  Preliminary Ultrasound results are as follows:  The uterus is anteverted and measures 9.9 x 6.5 x 6.1 cm. Echo texture is heterogenous with evidence of focal masses. Within the uterus are multiple suspected fibroids measuring: Fibroid 1:30.6 x 28.5 x 35.1 mm subserosal anterior Fibroid 2:12.6 x 10.8 x 11.3 mm subserosal posterior There are nabothian cysts in the cervix. The largest one measures 2.1 cm.  The IUD is in the lower uterine segment and cervix.  The Endometrium measures 6.4 mm.  Right Ovary measures 3.7 x 3.1 x 2.5 cm. There is a complex cyst in the right ovary measuring 21.4 x 13.5 x 19.7 mm. No blood flow is seen within.  Left Ovary measures 4.4 x 2.2 x 2.6 cm. There is a possible corpus luteum in the left ovary measuring 14.4 x 13.7 x 19.7 mm Survey of the adnexa demonstrates a linear structure with anechoic fluid. This may be a dilated left fallopian tube. It measures 44.8 x 14.5AP mm.  There is no free fluid in the cul de sac.   PMHx: She  has a past medical history of Adenomyosis (03/12/2015), Anxiety, Colitis, Depression, Headache, Hypothyroidism, Infertility, female, Obesity (BMI 35.0-39.9 without comorbidity), and Thyroid disease (2012). Also,  has a past surgical history that includes Mass excision; Tonsillectomy; Other surgical history; Left colectomy (05/23/2010); and Rectal exam under anesthesia (N/A, 10/03/2018)., family history includes  Diverticulosis in her father; Hypertension in her father and mother; Panic disorder in her mother; Prostate cancer in her maternal grandfather.,  reports that she quit smoking about 15 years ago. Her smoking use included cigarettes. She has a 2.00 pack-year smoking history. She has never used smokeless tobacco. She reports current alcohol use. She reports that she does not use drugs.  She has a current medication list which includes the following prescription(s): alprazolam, clindamycin, levonorgestrel, levothyroxine, multiple vitamin, and tretinoin. Also, is allergic to cortisporin  [neomycin-polymyxin-hc].  ROS  Objective: BP 100/80   Pulse 79   Temp 97.7 F (36.5 C)   Ht 5\' 5"  (1.651 m)   Wt 211 lb (95.7 kg)   LMP 01/07/2020 (Exact Date)   BMI 35.11 kg/m   Physical examination Constitutional NAD, Conversant     Extremities: Moves all appropriately.  Normal ROM for age. No lymphadenopathy.  Neuro: Grossly intact  Psych: Oriented to PPT.  Normal mood. Normal affect.   Assessment/ Plan:  IUD is not in the correct position  Discussed removing IUD  Discussion regarding what she wants to do for contraception/ menorrhagia after it is removed: hormones, replace IUD, permanent sterilization, etc Subserosal fibroids-no evidence of submucosal fibroids as cause of menorrhagia Complex cyst on left ovary Possible hydrosalpinyx on left Will discuss case with physician reading ultrasound  Dalia Heading, CNM

## 2020-01-13 NOTE — Progress Notes (Signed)
Scheduling an appointment with you for colposcopy and conference regarding surgery.

## 2020-01-13 NOTE — Telephone Encounter (Signed)
Mirena rcvd/charged 09/20/18

## 2020-02-02 ENCOUNTER — Ambulatory Visit (INDEPENDENT_AMBULATORY_CARE_PROVIDER_SITE_OTHER): Payer: Managed Care, Other (non HMO) | Admitting: Obstetrics and Gynecology

## 2020-02-02 ENCOUNTER — Other Ambulatory Visit: Payer: Self-pay

## 2020-02-02 ENCOUNTER — Encounter: Payer: Self-pay | Admitting: Obstetrics and Gynecology

## 2020-02-02 VITALS — BP 112/72 | HR 74 | Ht 65.0 in | Wt 213.0 lb

## 2020-02-02 DIAGNOSIS — T8332XD Displacement of intrauterine contraceptive device, subsequent encounter: Secondary | ICD-10-CM | POA: Diagnosis not present

## 2020-02-02 DIAGNOSIS — R87612 Low grade squamous intraepithelial lesion on cytologic smear of cervix (LGSIL): Secondary | ICD-10-CM

## 2020-02-02 DIAGNOSIS — N83209 Unspecified ovarian cyst, unspecified side: Secondary | ICD-10-CM

## 2020-02-02 DIAGNOSIS — N938 Other specified abnormal uterine and vaginal bleeding: Secondary | ICD-10-CM | POA: Diagnosis not present

## 2020-02-02 DIAGNOSIS — N7011 Chronic salpingitis: Secondary | ICD-10-CM

## 2020-02-02 DIAGNOSIS — N83201 Unspecified ovarian cyst, right side: Secondary | ICD-10-CM | POA: Diagnosis not present

## 2020-02-02 DIAGNOSIS — N939 Abnormal uterine and vaginal bleeding, unspecified: Secondary | ICD-10-CM

## 2020-02-02 NOTE — Progress Notes (Signed)
Obstetrics & Gynecology Office Visit   Chief Complaint:  Chief Complaint  Patient presents with  . Abnormal Pap Smear    Referred by CLG  . Advice Only    BTL v. Ablation    History of Present Illness:Jodi Carpenter is a 39 y.o. woman who presents today for recent LGSIL pap with negative HPV.  Preceding pap smears were normal as documented below.  No history of DES exposure or other risk factor for cervical dysplsia.  In addition the patient underwent TVUS imaging in early February for AUB and inability to visualize her IUD strings.  IUD is intrauterine but displaced into the lower uterine segment.  In addition there is evidence of a complex right ovarian cyst as well as left hydrosalpinx.  This in the context of prior colectomy and J-pouch procedure for ulcerative colitis and history of prior pelvic fluid collection requiring IR drainage at [facility] in 2011.  HSG conducted by Dr. Kenton Kingfisher in 2012 failed to show patency of the left fallopian tube.   Pap/Treatment History:  12/22/2019 LGSIL HPV negative 09/18/2018 NIL HPV negative 04/24/2017 NIL HPV negative  Review of Systems: review of systems is negative unless noted in HPI  Past Medical History:  Patient Active Problem List   Diagnosis Date Noted  . 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 Contraception management 10/18/2018  . Fistula-in-ano   . Anal fissure   . Hemorrhoidal skin tags   . Ulcerative colitis ([place]) 04/24/2017  . Hashimoto's thyroiditis 04/24/2017  . Anxiety 06/22/2015  . 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 Adult hypothyroidism 06/22/2015  . Menorrhagia with regular cycle 06/22/2015  . Adiposity 06/22/2015    Past Surgical History:  Patient Active Problem List   Diagnosis Date Noted  . 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 Contraception  management 10/18/2018  . Fistula-in-ano   . Anal fissure   . Hemorrhoidal skin tags   . Ulcerative colitis ([place]) 04/24/2017  . Hashimoto's thyroiditis 04/24/2017  . Anxiety 06/22/2015  . 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 Adult hypothyroidism 06/22/2015  . Menorrhagia with regular cycle 06/22/2015  . Adiposity 06/22/2015    Gynecologic History: Patient's last menstrual period was 02/01/2020.  Obstetric History: G0P0000  Family History:  Family History  Problem Relation Age of Onset  . Hypertension Mother   . Panic disorder Mother   . Hypertension Father   . Diverticulosis Father   . Prostate cancer Maternal Grandfather     Social History:  Social History   Socioeconomic History  . Marital status: Divorced    Spouse name: Not on file  . Number of children: 0  . Years of education: 81  . Highest education level: Not on file  Occupational History  . Occupation: Tree surgeon  Tobacco Use  . Smoking status: Former Smoker    Packs/day: 0.25    Years: 8.00    Pack years: 2.00    Types: Cigarettes    Quit date: 09/27/2004    Years since quitting: 15.3  . Smokeless tobacco: Never Used  Substance and Sexual Activity  . Alcohol use: Yes    Comment: 1-2 a month  . Drug use: No  . Sexual activity: Yes    Birth control/protection: I.U.D.  Other Topics Concern  . Not on file  Social History Narrative  . Not on  file   Social Determinants of Health   Financial Resource Strain:   . Difficulty of Paying Living Expenses: Not on file  Food Insecurity:   . Worried About Charity fundraiser in the Last Year: Not on file  . Ran Out of Food in the Last Year: Not on file  Transportation Needs:   . Lack of Transportation (Medical): Not on file  . Lack of Transportation (Non-Medical): Not on file  Physical Activity:   . Days of Exercise per Week: Not on file  . Minutes of Exercise per Session: Not on file  Stress:   . Feeling of Stress : Not on file  Social  Connections:   . Frequency of Communication with Friends and Family: Not on file  . Frequency of Social Gatherings with Friends and Family: Not on file  . Attends Religious Services: Not on file  . Active Member of Clubs or Organizations: Not on file  . Attends Archivist Meetings: Not on file  . Marital Status: Not on file  Intimate Partner Violence:   . Fear of Current or Ex-Partner: Not on file  . Emotionally Abused: Not on file  . Physically Abused: Not on file  . Sexually Abused: Not on file    Allergies:  Allergies  Allergen Reactions  . Cortisporin  [Neomycin-Polymyxin-Hc] Swelling    Medications: Prior to Admission medications   Medication Sig Start Date End Date Taking? Authorizing Provider  ALPRAZolam Duanne Moron) 0.5 MG tablet Take 1 tablet (0.5 mg total) by mouth 2 (two) times daily as needed for anxiety. 11/26/19   Jerrol Banana., MD  clindamycin (CLEOCIN-T) 1 % lotion Apply topically 2 (two) times daily.    [provider]  levonorgestrel (MIRENA) 20 MCG/24HR IUD 1 each by Intrauterine route once.    [provider]  levothyroxine (SYNTHROID) 125 MCG tablet TAKE 1 TABLET (125 MCG TOTAL) BY MOUTH DAILY BEFORE BREAKFAST. 07/11/19   Mar Daring, PA-C  Multiple Vitamins-Minerals (MULTIVITAMIN ADULT PO) Take 1 tablet by mouth daily.    [provider]  tretinoin (RETIN-A) 0.025 % cream Apply 1 application topically 3 (three) times a week. 09/24/19   [provider]    Physical Exam Vitals:  Vitals:   02/02/20 1340  BP: 112/72  Pulse: 74   Patient's last menstrual period was 02/01/2020.  General: NAD, well nourished, appears stated age 76: normocephalic, anicteric Pulmonary: No increased work of breathing Abdomen: soft, non-tender, non-distended well healed prior infraumbilical midline scar from J-pouch Neurologic: Grossly intact Psychiatric: mood appropriate, affect full  Female chaperone present for  pelvic and breast  portions of the physical exam  Physical Exam Abdominal:           Assessment: 39 y.o. G0P0000 follow up for LGSIL HPV negative pap  Plan: Problem List Items Addressed This Visit    None    Visit Diagnoses    Cyst of ovary, unspecified laterality    -  Primary   Relevant Orders   US Transvaginal Non-OB   Abnormal uterine bleeding       Displacement of intrauterine contraceptive device, subsequent encounter       LGSIL on Pap smear of cervix       Hydrosalpinx          1) LGSIL HPV negative pap -  Follow up pap smear  Recommended in 12 months  2) Right ovarian cyst and left hydrosalpinx - follow up US in 2 week to assess  ovarian cyst and hydrosalpinx.   Given prior J puch procedure suspect that the hydrosalpinx is secondary to pelvic infection.  A presacral fluid collection was drained following her collectomy at [facility] 05/30/2010 for a presacral fluid collection.   HSG 10/26/2011 showed spillage of contrast on the right but failed to show patency of the left fallopian tube.  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   - will need palmer's point entry in order to avoid any pelvic adhesive disease.  Depending on findings it may or may not be feasible to remove the fallopian tubes   3) AUB - IUD displaced in lower uterine segment on TVUS 01/08/2020. Patient declines replacement and is most interested in endometrial ablation.  Endometrial biopsy at that time with plans for hysteroscopy, novasure, and laparoscopic bilateral salpingectomy to be done following.  H&P to be obtained at time of ultrasound follow up  4) Return in about 1 week (around 02/09/2020) for TVUS and gyn follow up.   Malachy Mood, MD, [facility], [facility] 02/02/2020, 1:25 PM

## 2020-02-10 ENCOUNTER — Telehealth: Payer: Self-pay | Admitting: Obstetrics and Gynecology

## 2020-02-10 NOTE — Telephone Encounter (Signed)
LM for pt to rtn call. 

## 2020-02-10 NOTE — Telephone Encounter (Signed)
Called pt and offered dates for surgery.  3/30 w Dr Georgianne Fick  H&P & F/U appt scheduled together along with U/S 3/23 @ 4:00  Covid 3/[address] 8-10:30, drive up and wear mask. Adv pt to quar after tested.  Adv pt that a Pre-admit phone appt will be sch and listed in her MyChart (active user). Also she may rec phone calls from hosp pre service center and [facility] as prim/only ins

## 2020-02-17 ENCOUNTER — Ambulatory Visit (INDEPENDENT_AMBULATORY_CARE_PROVIDER_SITE_OTHER): Payer: Managed Care, Other (non HMO)

## 2020-02-17 ENCOUNTER — Other Ambulatory Visit (HOSPITAL_COMMUNITY)
Admission: RE | Admit: 2020-02-17 | Discharge: 2020-02-17 | Disposition: A | Discharge status: Home / Self Care | Payer: Managed Care, Other (non HMO) | Source: Ambulatory Visit | Attending: Obstetrics and Gynecology | Admitting: Obstetrics and Gynecology

## 2020-02-17 ENCOUNTER — Ambulatory Visit (INDEPENDENT_AMBULATORY_CARE_PROVIDER_SITE_OTHER): Payer: Managed Care, Other (non HMO) | Admitting: Obstetrics and Gynecology

## 2020-02-17 ENCOUNTER — Encounter: Payer: Self-pay | Admitting: Obstetrics and Gynecology

## 2020-02-17 ENCOUNTER — Other Ambulatory Visit: Payer: Self-pay

## 2020-02-17 VITALS — BP 110/64 | HR 60 | Ht 64.0 in | Wt 215.0 lb

## 2020-02-17 DIAGNOSIS — N83291 Other ovarian cyst, right side: Secondary | ICD-10-CM

## 2020-02-17 DIAGNOSIS — N939 Abnormal uterine and vaginal bleeding, unspecified: Secondary | ICD-10-CM | POA: Diagnosis not present

## 2020-02-17 DIAGNOSIS — D252 Subserosal leiomyoma of uterus: Secondary | ICD-10-CM

## 2020-02-17 DIAGNOSIS — N7011 Chronic salpingitis: Secondary | ICD-10-CM

## 2020-02-17 DIAGNOSIS — N83292 Other ovarian cyst, left side: Secondary | ICD-10-CM | POA: Diagnosis not present

## 2020-02-17 DIAGNOSIS — N83209 Unspecified ovarian cyst, unspecified side: Secondary | ICD-10-CM

## 2020-02-17 DIAGNOSIS — N938 Other specified abnormal uterine and vaginal bleeding: Secondary | ICD-10-CM

## 2020-02-17 NOTE — Progress Notes (Signed)
Obstetrics & Gynecology Surgery H&P    Chief Complaint: Scheduled Surgery   History of Present Illness: Patient is a 39 y.o. G0P0000 presenting for scheduled hysteroscopy, novasure endometrial ablation, laparoscopic bilateral salpingectomy for the treatment or further evaluation of abnormal uterine bleeding. bilateral hydrosalpinx.   Prior Treatments prior to proceeding with surgery include: serial ultrasound evaluation  Preoperative Pap: 12/22/2019 LGSIL HPV negative Preoperative Endometrial biopsy: obtained today Preoperative Ultrasound: 01/08/2020 and 02/17/2020   Review of Systems:10 point review of systems  Past Medical History:  Patient Active Problem List   Diagnosis Date Noted  . 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 Contraception management 10/18/2018  . Fistula-in-ano   . Anal fissure   . Hemorrhoidal skin tags   . Ulcerative colitis ([place]) 04/24/2017  . Hashimoto's thyroiditis 04/24/2017  . Anxiety 06/22/2015  . 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 Adult hypothyroidism 06/22/2015  . Menorrhagia with regular cycle 06/22/2015  . Adiposity 06/22/2015    Past Surgical History:  Past Surgical History:  Procedure Laterality Date  . LEFT COLECTOMY  05/23/2010   sigmoid colectomy with J pouch  . MASS EXCISION     pilonidal cyst and abscess  . OTHER SURGICAL HISTORY     illeionidal anal stenosis   . RECTAL EXAM UNDER ANESTHESIA N/A 10/03/2018   Procedure: RECTAL EXAM UNDER ANESTHESIA;  Surgeon: Olean Ree, MD;  Location: [facility] ORS;  Service: General;  Laterality: N/A;  . TONSILLECTOMY      Family History:  Family History  Problem Relation Age of Onset  . Hypertension Mother   . Panic disorder Mother   . Hypertension Father   . Diverticulosis Father   . Prostate cancer Maternal Grandfather     Social History:  Social History   Socioeconomic History  . Marital status:  Divorced    Spouse name: Not on file  . Number of children: 0  . Years of education: 28  . Highest education level: Not on file  Occupational History  . Occupation: Tree surgeon  Tobacco Use  . Smoking status: Former Smoker    Packs/day: 0.25    Years: 8.00    Pack years: 2.00    Types: Cigarettes    Quit date: 09/27/2004    Years since quitting: 15.4  . Smokeless tobacco: Never Used  Substance and Sexual Activity  . Alcohol use: Yes    Comment: 1-2 a month  . Drug use: No  . Sexual activity: Yes    Birth control/protection: I.U.D.  Other Topics Concern  . Not on file  Social History Narrative  . Not on file   Social Determinants of Health   Financial Resource Strain:   . Difficulty of Paying Living Expenses:   Food Insecurity:   . Worried About Charity fundraiser in the Last Year:   . Arboriculturist in the Last Year:   Transportation Needs:   . Film/video editor (Medical):   Marland Kitchen Lack of Transportation (Non-Medical):   Physical Activity:   . Days of Exercise per Week:   . Minutes of Exercise per Session:   Stress:   . Feeling of Stress :   Social Connections:   . Frequency of Communication with Friends and Family:   . Frequency of Social Gatherings with Friends and Family:   . Attends Religious Services:   . Active Member of Clubs or Organizations:   . Attends Club or  Organization Meetings:   Marland Kitchen Marital Status:   Intimate Partner Violence:   . Fear of Current or Ex-Partner:   . Emotionally Abused:   Marland Kitchen Physically Abused:   . Sexually Abused:     Allergies:  Allergies  Allergen Reactions  . Cortisporin  [Neomycin-Polymyxin-Hc] Swelling    Medications: Prior to Admission medications   Medication Sig Start Date End Date Taking? Authorizing Provider  ALPRAZolam Duanne Moron) 0.5 MG tablet Take 1 tablet (0.5 mg total) by mouth 2 (two) times daily as needed for anxiety. Patient taking differently: Take 0.25 mg by mouth 2 (two) times daily as needed for  anxiety.  11/26/19  Yes Jerrol Banana., MD  levonorgestrel [facility]) 20 MCG/24HR IUD 1 each by Intrauterine route once.   Yes [provider]  levothyroxine (SYNTHROID) 125 MCG tablet TAKE 1 TABLET (125 MCG TOTAL) BY MOUTH DAILY BEFORE BREAKFAST. 07/11/19  Yes Mar Daring, PA-C  Multiple Vitamins-Minerals (MULTIVITAMIN ADULT PO) Take 1 tablet by mouth daily.   Yes [provider]  tretinoin (RETIN-A) 0.025 % cream Apply 1 application topically 3 (three) times a week. 09/24/19  Yes [provider]    Physical Exam Vitals: Blood pressure 110/64, pulse 60, height 5\' 4"  (1.626 m), weight 215 lb (97.5 kg), last menstrual period 02/01/2020. General: NAD HEENT: normocephalic, anicteric Pulmonary: No increased work of breathing Cardiovascular: RRR, distal pulses 2+ Abdomen:soft, non-tender, non-distended Genitourinary: normal external female genital. Normal uterus and cervix, good mobility noted Extremities: no edema, erythema, or tenderness Neurologic: Grossly intact Psychiatric: mood appropriate, affect full   ENDOMETRIAL BIOPSY     The indications for endometrial biopsy were reviewed.   Risks of the biopsy including cramping, bleeding, infection, uterine perforation, inadequate specimen and need for additional procedures  were discussed. The patient states she understands and agrees to undergo procedure today. Consent was signed. Time out was performed. Urine HCG was negative. A Graves speculum was placed and the cervix was brought into view.  The cervix was prepped with Betadine. A single-toothed tenaculum was not placed on the anterior lip of the cervix for traction. A 3 mm pipelle was introduced through the cervix into the endometrial cavity without difficulty to a depth of 8cm, and a small amount of tissue was obtained, the resulting specime sent to pathology. The instruments were removed from the patient's vagina. Minimal bleeding from the cervix was  noted. The patient tolerated the procedure well. Routine post-procedure instructions were given to the patient.  She will be contacted by phone one results become available.     Imaging No results found.  Assessment: 39 y.o. G0P0000 presenting for scheduled hysteroscopy, novasure endometrial ablation, laparoscopic bilateral salpingectomy  Plan: 1) The patient was counseled on the overall effectiveness of endometrial ablation in achieving amenorrhea.  She is aware that some patient may continue to have menstrual cycles although these are generally greatly reduced in flow.  In addition she was quoted a failure rate for endometrial ablation of approximately 25% within the frist 4 years, but these failures may happen at any time during or after the initial 4 year postop period.  She is aware that pregnancy is contra-indicated in the setting of prior endometrial ablation, and that ablation itself does not confer any contraceptive benefit.  She will therefore need to continue to rely on some means of contraception following the procedure.  Although rare and generally confined to patient who have undergone prior tubal ligatoin, post-ablation tubal sterilizaton syndrome (PATSS) may also occur with  no reliable incidence rates as the majority of published literature is limited to case reports.   Prior to being considered a candidate for Novasure ablation she will need to undergo endometrial biopsy to rule out endometrial hyperplasia or malignancy as the cause of her bleeding, have an up to date pap on record, and undergo transvaginal ultrasound to verify the absence of focal endometrial lesion which may need to be addressed prior to proceeding with ablation.  In addition she is aware that the device is limited for use in women with a normal uterine cavity and uterine leiomyomata <3cm in size.  If present leiomyomata may increase the long-term failure rate of the procedure.  In rare instances the presence of a uterine  septum or arcuate uterus, which may not be readily apparent on preoperative ultrasound, may necessitate the procedure to be aborted.    I have had a careful discussion with this patient about all the options available and the risk/benefits of each. I have fully informed this patient that a laparoscopy may subject her to a variety of discomforts and risks: She understands that most patients have surgery with little difficulty, but problems can happen ranging from minor to fatal. These include nausea, vomiting, pain, bleeding, infection, poor healing, hernia, or formation of adhesions. Unexpected reactions may occur from any drug or anesthetic given. Unintended injury may occur to other pelvic or abdominal structures such as Fallopian tubes, ovaries, bladder, ureter (tube from kidney to bladder), or bowel. Nerves going from the pelvis to the legs may be injured. Any such injury may require immediate or later additional surgery to correct the problem. Excessive blood loss requiring transfusion is very unlikely but possible. Dangerous blood clots may form in the legs or lungs. Physical and sexual activity will be restricted in varying degrees for an indeterminate period of time but most often 2-4 weeks. She understands that the plan is to do this laparoscopically, however, there is a chance that this will need to be performed via a larger incision. Finally, she understands that it is impossible to list every possible undesirable effect and that the condition for which surgery is done is not always cured or significantly improved, and in rare cases may be even worsen. Ample time was given to answer all questions.   2) Routine postoperative instructions were reviewed with the patient and her family in detail today including the expected length of recovery and likely postoperative course.  The patient concurred with the proposed plan, giving informed written consent for the surgery today.  Patient instructed on the  importance of being NPO after midnight prior to her procedure.  If warranted preoperative prophylactic antibiotics and SCDs ordered on call to the OR to meet SCIP guidelines and adhere to recommendation laid forth in [place] Number 104 May 2009  "Antibiotic Prophylaxis for Gynecologic Procedures".     Malachy Mood, MD, [facility], [facility] 02/17/2020, 4:51 PM

## 2020-02-18 ENCOUNTER — Encounter
Admission: RE | Admit: 2020-02-18 | Discharge: 2020-02-18 | Disposition: A | Discharge status: Home / Self Care | Payer: Managed Care, Other (non HMO) | Source: Ambulatory Visit | Attending: Obstetrics and Gynecology | Admitting: Obstetrics and Gynecology

## 2020-02-18 ENCOUNTER — Other Ambulatory Visit: Payer: Self-pay

## 2020-02-18 NOTE — Patient Instructions (Signed)
Your procedure is scheduled on: 02/24/20 Report to [facility]. To find out your arrival time please call [phone] between 1PM - 3PM on 02/23/20.  Remember: Instructions that are not followed completely may result in serious medical risk, up to and including death, or upon the discretion of your surgeon and anesthesiologist your surgery may need to be rescheduled.     _X__ 1. Do not eat food after midnight the night before your procedure.                 No gum chewing or hard candies. You may drink clear liquids up to 2 hours                 before you are scheduled to arrive for your surgery- DO not drink clear                 liquids within 2 hours of the start of your surgery.                 Clear Liquids include:  water, apple juice without pulp, clear carbohydrate                 drink such as Clearfast or Gatorade, Black Coffee or Tea (Do not add                 anything to coffee or tea). Diabetics water only  __X__2.  On the morning of surgery brush your teeth with toothpaste and water, you                 may rinse your mouth with mouthwash if you wish.  Do not swallow any              toothpaste of mouthwash.     _X__ 3.  No Alcohol for 24 hours before or after surgery.   _X__ 4.  Do Not Smoke or use e-cigarettes For 24 Hours Prior to Your Surgery.                 Do not use any chewable tobacco products for at least 6 hours prior to                 surgery.  ____  5.  Bring all medications with you on the day of surgery if instructed.   __X__  6.  Notify your doctor if there is any change in your medical condition      (cold, fever, infections).     Do not wear jewelry, make-up, hairpins, clips or nail polish. Do not wear lotions, powders, or perfumes.  Do not shave 48 hours prior to surgery. Men may shave face and neck. Do not bring valuables to the hospital.    [facility] is not responsible for any belongings or  valuables.  Contacts, dentures/partials or body piercings may not be worn into surgery. Bring a case for your contacts, glasses or hearing aids, a denture cup will be supplied. Leave your suitcase in the car. After surgery it may be brought to your room. For patients admitted to the hospital, discharge time is determined by your treatment team.   Patients discharged the day of surgery will not be allowed to drive home.   Please read over the following fact sheets that you were given:   MRSA Information  __X__ Take these medicines the morning of surgery with A SIP OF WATER:  1. levothyroxine (SYNTHROID) 125 MCG tablet  2.   3.   4.  5.  6.  ____ Fleet Enema (as directed)   __X__ Use CHG Soap/SAGE wipes as directed  ____ Use inhalers on the day of surgery  ____ Stop metformin/Janumet/Farxiga 2 days prior to surgery    ____ Take 1/2 of usual insulin dose the night before surgery. No insulin the morning          of surgery.   ____ Stop Blood Thinners Coumadin/Plavix/Xarelto/Pleta/Pradaxa/Eliquis/Effient/Aspirin  on   Or contact your Surgeon, Cardiologist or Medical Doctor regarding  ability to stop your blood thinners  __X__ Stop Anti-inflammatories 7 days before surgery such as Advil, Ibuprofen, Motrin,  BC or Goodies Powder, Naprosyn, Naproxen, Aleve, Aspirin    __X__ Stop all herbal supplements, fish oil or vitamin E until after surgery.    ____ Bring C-Pap to the hospital.

## 2020-02-19 LAB — SURGICAL PATHOLOGY

## 2020-02-20 ENCOUNTER — Other Ambulatory Visit
Admission: RE | Admit: 2020-02-20 | Discharge: 2020-02-20 | Disposition: A | Discharge status: Home / Self Care | Payer: Managed Care, Other (non HMO) | Source: Ambulatory Visit | Attending: Obstetrics and Gynecology | Admitting: Obstetrics and Gynecology

## 2020-02-20 DIAGNOSIS — Z01812 Encounter for preprocedural laboratory examination: Secondary | ICD-10-CM | POA: Diagnosis not present

## 2020-02-20 DIAGNOSIS — Z20822 Contact with and (suspected) exposure to covid-19: Secondary | ICD-10-CM | POA: Insufficient documentation

## 2020-02-20 LAB — TYPE AND SCREEN
ABO/RH(D): A POS
Antibody Screen: NEGATIVE

## 2020-02-20 LAB — BASIC METABOLIC PANEL
Anion gap: 8 (ref 5–15)
BUN: 17 mg/dL (ref 6–20)
CO2: 25 mmol/L (ref 22–32)
Calcium: 9.2 mg/dL (ref 8.9–10.3)
Chloride: 106 mmol/L (ref 98–111)
Creatinine, Ser: 0.74 mg/dL (ref 0.44–1.00)
GFR calc Af Amer: >60 mL/min (ref >60)
GFR calc non Af Amer: >60 mL/min (ref >60)
Glucose, Bld: 95 mg/dL (ref 70–99)
Potassium: 4.3 mmol/L (ref 3.5–5.1)
Sodium: 139 mmol/L (ref 135–145)

## 2020-02-20 LAB — CBC
HCT: 40.6 % (ref 36.0–46.0)
Hemoglobin: 13.7 g/dL (ref 12.0–15.0)
MCH: 31 pg (ref 26.0–34.0)
MCHC: 33.7 g/dL (ref 30.0–36.0)
MCV: 91.9 fL (ref 80.0–100.0)
Platelets: 246 10*3/uL (ref 150–400)
RBC: 4.42 MIL/uL (ref 3.87–5.11)
RDW: 12 % (ref 11.5–15.5)
WBC: 6.8 10*3/uL (ref 4.0–10.5)
nRBC: 0 % (ref 0.0–0.2)

## 2020-02-20 LAB — SARS CORONAVIRUS 2 (TAT 6-24 HRS): SARS Coronavirus 2: NEGATIVE

## 2020-02-24 ENCOUNTER — Encounter: Payer: Self-pay | Admitting: Obstetrics and Gynecology

## 2020-02-24 ENCOUNTER — Ambulatory Visit
Admission: RE | Admit: 2020-02-24 | Discharge: 2020-02-24 | Disposition: A | Discharge status: Home / Self Care | Payer: Managed Care, Other (non HMO) | Attending: Obstetrics and Gynecology | Admitting: Obstetrics and Gynecology

## 2020-02-24 ENCOUNTER — Ambulatory Visit: Payer: Managed Care, Other (non HMO) | Admitting: Anesthesiology

## 2020-02-24 ENCOUNTER — Other Ambulatory Visit: Payer: Self-pay

## 2020-02-24 ENCOUNTER — Encounter
Admission: RE | Disposition: A | Discharge status: Home / Self Care | Payer: Self-pay | Source: Home / Self Care | Attending: Obstetrics and Gynecology

## 2020-02-24 DIAGNOSIS — Z9889 Other specified postprocedural states: Secondary | ICD-10-CM

## 2020-02-24 DIAGNOSIS — N7011 Chronic salpingitis: Secondary | ICD-10-CM | POA: Insufficient documentation

## 2020-02-24 DIAGNOSIS — Z79899 Other long term (current) drug therapy: Secondary | ICD-10-CM | POA: Insufficient documentation

## 2020-02-24 DIAGNOSIS — Z79890 Hormone replacement therapy (postmenopausal): Secondary | ICD-10-CM | POA: Diagnosis not present

## 2020-02-24 DIAGNOSIS — T8339XD Other mechanical complication of intrauterine contraceptive device, subsequent encounter: Secondary | ICD-10-CM

## 2020-02-24 DIAGNOSIS — T8339XA Other mechanical complication of intrauterine contraceptive device, initial encounter: Secondary | ICD-10-CM | POA: Diagnosis not present

## 2020-02-24 DIAGNOSIS — F419 Anxiety disorder, unspecified: Secondary | ICD-10-CM | POA: Diagnosis not present

## 2020-02-24 DIAGNOSIS — Q5128 Other doubling of uterus, other specified: Secondary | ICD-10-CM | POA: Insufficient documentation

## 2020-02-24 DIAGNOSIS — Z6835 Body mass index (BMI) 35.0-35.9, adult: Secondary | ICD-10-CM | POA: Insufficient documentation

## 2020-02-24 DIAGNOSIS — E669 Obesity, unspecified: Secondary | ICD-10-CM | POA: Insufficient documentation

## 2020-02-24 DIAGNOSIS — Q5121 Other complete doubling of uterus: Secondary | ICD-10-CM

## 2020-02-24 DIAGNOSIS — N939 Abnormal uterine and vaginal bleeding, unspecified: Secondary | ICD-10-CM | POA: Insufficient documentation

## 2020-02-24 DIAGNOSIS — N736 Female pelvic peritoneal adhesions (postinfective): Secondary | ICD-10-CM

## 2020-02-24 DIAGNOSIS — Z30432 Encounter for removal of intrauterine contraceptive device: Secondary | ICD-10-CM | POA: Diagnosis not present

## 2020-02-24 DIAGNOSIS — E063 Autoimmune thyroiditis: Secondary | ICD-10-CM | POA: Diagnosis not present

## 2020-02-24 DIAGNOSIS — X58XXXA Exposure to other specified factors, initial encounter: Secondary | ICD-10-CM | POA: Diagnosis not present

## 2020-02-24 DIAGNOSIS — N938 Other specified abnormal uterine and vaginal bleeding: Secondary | ICD-10-CM | POA: Diagnosis not present

## 2020-02-24 DIAGNOSIS — D251 Intramural leiomyoma of uterus: Secondary | ICD-10-CM | POA: Insufficient documentation

## 2020-02-24 HISTORY — PX: LAPAROSCOPY: SHX197

## 2020-02-24 HISTORY — PX: HYSTEROSCOPY: SHX211

## 2020-02-24 HISTORY — PX: IUD REMOVAL: SHX5392

## 2020-02-24 LAB — ABO/RH: ABO/RH(D): A POS

## 2020-02-24 LAB — POCT PREGNANCY, URINE: Preg Test, Ur: NEGATIVE

## 2020-02-24 SURGERY — REMOVAL, INTRAUTERINE DEVICE
Anesthesia: General

## 2020-02-24 MED ORDER — BUPIVACAINE HCL (PF) 0.5 % IJ SOLN
INTRAMUSCULAR | Status: AC
  Filled 2020-02-24: qty 30

## 2020-02-24 MED ORDER — ONDANSETRON HCL 4 MG/2ML IJ SOLN
INTRAMUSCULAR | Status: DC | PRN
  Administered 2020-02-24: 4 mg via INTRAVENOUS

## 2020-02-24 MED ORDER — LACTATED RINGERS IV SOLN: INTRAVENOUS | Status: DC

## 2020-02-24 MED ORDER — EPHEDRINE SULFATE 50 MG/ML IJ SOLN
INTRAMUSCULAR | Status: DC | PRN
  Administered 2020-02-24: 10 mg via INTRAVENOUS

## 2020-02-24 MED ORDER — FENTANYL CITRATE (PF) 100 MCG/2ML IJ SOLN: 25.0000 ug | INTRAMUSCULAR | Status: DC | PRN

## 2020-02-24 MED ORDER — PROPOFOL 10 MG/ML IV BOLUS
INTRAVENOUS | Status: DC | PRN
  Administered 2020-02-24: 200 mg via INTRAVENOUS

## 2020-02-24 MED ORDER — DEXMEDETOMIDINE HCL IN NACL 80 MCG/20ML IV SOLN
INTRAVENOUS | Status: AC
  Filled 2020-02-24: qty 20

## 2020-02-24 MED ORDER — MIDAZOLAM HCL 2 MG/2ML IJ SOLN
INTRAMUSCULAR | Status: AC
  Filled 2020-02-24: qty 2

## 2020-02-24 MED ORDER — ROCURONIUM BROMIDE 100 MG/10ML IV SOLN
INTRAVENOUS | Status: DC | PRN
  Administered 2020-02-24: 50 mg via INTRAVENOUS
  Administered 2020-02-24: 20 mg via INTRAVENOUS

## 2020-02-24 MED ORDER — MIDAZOLAM HCL 2 MG/2ML IJ SOLN
INTRAMUSCULAR | Status: DC | PRN
  Administered 2020-02-24: 2 mg via INTRAVENOUS

## 2020-02-24 MED ORDER — FENTANYL CITRATE (PF) 100 MCG/2ML IJ SOLN
INTRAMUSCULAR | Status: DC | PRN
  Administered 2020-02-24 (×2): 50 ug via INTRAVENOUS

## 2020-02-24 MED ORDER — OXYCODONE-ACETAMINOPHEN 5-325 MG PO TABS: 1.0000 | ORAL_TABLET | ORAL | 0 refills | Status: DC | PRN

## 2020-02-24 MED ORDER — SODIUM CHLORIDE (PF) 0.9 % IJ SOLN
INTRAMUSCULAR | Status: AC
  Filled 2020-02-24: qty 10

## 2020-02-24 MED ORDER — FAMOTIDINE 20 MG PO TABS
ORAL_TABLET | ORAL | Status: AC
  Administered 2020-02-24: 06:00:00 20 mg via ORAL
  Filled 2020-02-24: qty 1

## 2020-02-24 MED ORDER — DEXMEDETOMIDINE HCL 200 MCG/2ML IV SOLN
INTRAVENOUS | Status: DC | PRN
  Administered 2020-02-24: 12 ug via INTRAVENOUS

## 2020-02-24 MED ORDER — PHENYLEPHRINE HCL (PRESSORS) 10 MG/ML IV SOLN
INTRAVENOUS | Status: AC
  Filled 2020-02-24: qty 1

## 2020-02-24 MED ORDER — CEFAZOLIN SODIUM-DEXTROSE 2-4 GM/100ML-% IV SOLN
2.0000 g | INTRAVENOUS | Status: AC
  Administered 2020-02-24: 2 g via INTRAVENOUS

## 2020-02-24 MED ORDER — SUCCINYLCHOLINE CHLORIDE 200 MG/10ML IV SOSY
PREFILLED_SYRINGE | INTRAVENOUS | Status: AC
  Filled 2020-02-24: qty 10

## 2020-02-24 MED ORDER — ONDANSETRON HCL 4 MG/2ML IJ SOLN
INTRAMUSCULAR | Status: AC
  Filled 2020-02-24: qty 2

## 2020-02-24 MED ORDER — SUGAMMADEX SODIUM 500 MG/5ML IV SOLN
INTRAVENOUS | Status: DC | PRN
  Administered 2020-02-24: 200 mg via INTRAVENOUS

## 2020-02-24 MED ORDER — DEXAMETHASONE SODIUM PHOSPHATE 10 MG/ML IJ SOLN
INTRAMUSCULAR | Status: DC | PRN
  Administered 2020-02-24: 10 mg via INTRAVENOUS

## 2020-02-24 MED ORDER — DEXAMETHASONE SODIUM PHOSPHATE 10 MG/ML IJ SOLN
INTRAMUSCULAR | Status: AC
  Filled 2020-02-24: qty 1

## 2020-02-24 MED ORDER — KETOROLAC TROMETHAMINE 30 MG/ML IJ SOLN
INTRAMUSCULAR | Status: AC
  Filled 2020-02-24: qty 1

## 2020-02-24 MED ORDER — FENTANYL CITRATE (PF) 100 MCG/2ML IJ SOLN
INTRAMUSCULAR | Status: AC
  Filled 2020-02-24: qty 2

## 2020-02-24 MED ORDER — ROCURONIUM BROMIDE 10 MG/ML (PF) SYRINGE
PREFILLED_SYRINGE | INTRAVENOUS | Status: AC
  Filled 2020-02-24: qty 10

## 2020-02-24 MED ORDER — LACTATED RINGERS IV SOLN: INTRAVENOUS | Status: DC | PRN

## 2020-02-24 MED ORDER — OXYCODONE HCL 5 MG PO TABS: 5.0000 mg | ORAL_TABLET | Freq: Once | ORAL | Status: DC | PRN

## 2020-02-24 MED ORDER — LIDOCAINE HCL (CARDIAC) PF 100 MG/5ML IV SOSY
PREFILLED_SYRINGE | INTRAVENOUS | Status: DC | PRN
  Administered 2020-02-24: 100 mg via INTRAVENOUS

## 2020-02-24 MED ORDER — PROPOFOL 500 MG/50ML IV EMUL
INTRAVENOUS | Status: AC
  Filled 2020-02-24: qty 50

## 2020-02-24 MED ORDER — CEFAZOLIN SODIUM-DEXTROSE 2-4 GM/100ML-% IV SOLN
INTRAVENOUS | Status: AC
  Filled 2020-02-24: qty 100

## 2020-02-24 MED ORDER — FAMOTIDINE 20 MG PO TABS: 20.0000 mg | ORAL_TABLET | Freq: Once | ORAL | Status: AC

## 2020-02-24 MED ORDER — EPHEDRINE 5 MG/ML INJ
INTRAVENOUS | Status: AC
  Filled 2020-02-24: qty 10

## 2020-02-24 MED ORDER — BUPIVACAINE HCL 0.5 % IJ SOLN
INTRAMUSCULAR | Status: DC | PRN
  Administered 2020-02-24: 15 mL

## 2020-02-24 MED ORDER — OXYCODONE HCL 5 MG/5ML PO SOLN: 5.0000 mg | Freq: Once | ORAL | Status: DC | PRN

## 2020-02-24 SURGICAL SUPPLY — 60 items
ADH SKN CLS APL DERMABOND .7 (GAUZE/BANDAGES/DRESSINGS) ×6
ANCHOR TIS RET SYS 235ML (MISCELLANEOUS) IMPLANT
APL PRP STRL LF DISP 70% ISPRP (MISCELLANEOUS) ×3
BAG DRN RND TRDRP ANRFLXCHMBR (UROLOGICAL SUPPLIES) ×3
BAG TISS RTRVL C235 10X14 (MISCELLANEOUS)
BAG URINE DRAIN 2000ML AR STRL (UROLOGICAL SUPPLIES) ×5 IMPLANT
BLADE SURG SZ11 CARB STEEL (BLADE) ×5 IMPLANT
CANISTER SUCT 1200ML W/VALVE (MISCELLANEOUS) ×5 IMPLANT
CATH FOLEY 2WAY  5CC 16FR (CATHETERS) ×5
CATH FOLEY 2WAY 5CC 16FR (CATHETERS) ×3
CATH ROBINSON RED A/P 16FR (CATHETERS) ×5 IMPLANT
CATH URTH 16FR FL 2W BLN LF (CATHETERS) ×3 IMPLANT
CHLORAPREP W/TINT 26 (MISCELLANEOUS) ×5 IMPLANT
COVER WAND RF STERILE (DRAPES) ×5 IMPLANT
DERMABOND ADVANCED (GAUZE/BANDAGES/DRESSINGS) ×4
DERMABOND ADVANCED .7 DNX12 (GAUZE/BANDAGES/DRESSINGS) ×4 IMPLANT
DEVICE MYOSURE LITE (MISCELLANEOUS) IMPLANT
DRAPE CAMERA CLOSED 9X96 (DRAPES) ×3 IMPLANT
ELECT REM PT RETURN 9FT ADLT (ELECTROSURGICAL)
ELECTRODE REM PT RTRN 9FT ADLT (ELECTROSURGICAL) IMPLANT
GLOVE BIO SURGEON STRL SZ7 (GLOVE) ×5 IMPLANT
GLOVE INDICATOR 7.5 STRL GRN (GLOVE) ×5 IMPLANT
GOWN STRL REUS W/ TWL LRG LVL3 (GOWN DISPOSABLE) ×6 IMPLANT
GOWN STRL REUS W/ TWL XL LVL3 (GOWN DISPOSABLE) IMPLANT
GOWN STRL REUS W/TWL LRG LVL3 (GOWN DISPOSABLE) ×10
GOWN STRL REUS W/TWL XL LVL3 (GOWN DISPOSABLE)
GRASPER SUT TROCAR 14GX15 (MISCELLANEOUS) IMPLANT
IRRIGATION STRYKERFLOW (MISCELLANEOUS) IMPLANT
IRRIGATOR STRYKERFLOW (MISCELLANEOUS)
IV LACTATED RINGER IRRG 3000ML (IV SOLUTION)
IV LACTATED RINGERS 1000ML (IV SOLUTION) ×5 IMPLANT
IV LR IRRIG 3000ML ARTHROMATIC (IV SOLUTION) IMPLANT
IV NS IRRIG 3000ML ARTHROMATIC (IV SOLUTION) ×2 IMPLANT
KIT PINK PAD W/HEAD ARE REST (MISCELLANEOUS) ×5
KIT PINK PAD W/HEAD ARM REST (MISCELLANEOUS) ×3 IMPLANT
KIT PROCEDURE FLUENT (KITS) ×5 IMPLANT
KIT TURNOVER CYSTO (KITS) ×5 IMPLANT
LABEL OR SOLS (LABEL) ×5 IMPLANT
MYOSURE XL FIBROID (MISCELLANEOUS)
NS IRRIG 500ML POUR BTL (IV SOLUTION) ×5 IMPLANT
PACK DNC HYST (MISCELLANEOUS) ×5 IMPLANT
PACK GYN LAPAROSCOPIC (MISCELLANEOUS) ×5 IMPLANT
PAD OB MATERNITY 4.3X12.25 (PERSONAL CARE ITEMS) ×5 IMPLANT
PAD PREP 24X41 OB/GYN DISP (PERSONAL CARE ITEMS) ×5 IMPLANT
SCISSORS METZENBAUM CVD 33 (INSTRUMENTS) IMPLANT
SEAL ROD LENS SCOPE MYOSURE (ABLATOR) ×5 IMPLANT
SET TUBE SMOKE EVAC HIGH FLOW (TUBING) ×5 IMPLANT
SHEARS HARMONIC ACE PLUS 36CM (ENDOMECHANICALS) IMPLANT
SLEEVE ENDOPATH XCEL 5M (ENDOMECHANICALS) ×10 IMPLANT
SUT MNCRL 4-0 (SUTURE) ×5
SUT MNCRL 4-0 27XMFL (SUTURE) ×3
SUT MNCRL AB 4-0 PS2 18 (SUTURE) ×5 IMPLANT
SUT VIC AB 2-0 UR6 27 (SUTURE) ×5 IMPLANT
SUTURE MNCRL 4-0 27XMF (SUTURE) ×1 IMPLANT
SYSTEM TISS REMOVAL MYOSURE XL (MISCELLANEOUS) IMPLANT
TOWEL OR 17X26 4PK STRL BLUE (TOWEL DISPOSABLE) ×5 IMPLANT
TROCAR ENDO BLADELESS 11MM (ENDOMECHANICALS) IMPLANT
TROCAR XCEL NON-BLD 5MMX100MML (ENDOMECHANICALS) ×5 IMPLANT
TUBING CONNECTING 10 (TUBING) ×4 IMPLANT
TUBING CONNECTING 10' (TUBING) ×1

## 2020-02-24 NOTE — Discharge Instructions (Signed)
AMBULATORY SURGERY  °DISCHARGE INSTRUCTIONS ° ° °1) The drugs that you were given will stay in your system until tomorrow so for the next 24 hours you should not: ° °A) Drive an automobile °B) Make any legal decisions °C) Drink any alcoholic beverage ° ° °2) You may resume regular meals tomorrow.  Today it is better to start with liquids and gradually work up to solid foods. ° °You may eat anything you prefer, but it is better to start with liquids, then soup and crackers, and gradually work up to solid foods. ° ° °3) Please notify your doctor immediately if you have any unusual bleeding, trouble breathing, redness and pain at the surgery site, drainage, fever, or pain not relieved by medication. ° ° ° °4) Additional Instructions: ° ° ° ° ° ° ° °Please contact your physician with any problems or Same Day Surgery at [phone], Monday through Friday 6 am to 4 pm, or [place] [facility] [place] [facility] number at [phone]. °

## 2020-02-24 NOTE — Anesthesia Postprocedure Evaluation (Signed)
Anesthesia Post Note  Patient: Jodi Carpenter  Procedure(s) Performed: INTRAUTERINE DEVICE (IUD) REMOVAL Mirena (N/A ) HYSTEROSCOPY LAPAROSCOPY DIAGNOSTIC  Patient location during evaluation: PACU Anesthesia Type: General Level of consciousness: awake and alert Pain management: pain level controlled Vital Signs Assessment: post-procedure vital signs reviewed and stable Respiratory status: spontaneous breathing, nonlabored ventilation, respiratory function stable and patient connected to nasal cannula oxygen Cardiovascular status: blood pressure returned to baseline and stable Postop Assessment: no apparent nausea or vomiting Anesthetic complications: no     Last Vitals:  Vitals:   02/24/20 0942 02/24/20 0948  BP: (!) 99/49 (!) 102/50  Pulse: 69 64  Resp: 16 13  Temp: 36.8 C   SpO2: 100% 100%    Last Pain:  Vitals:   02/24/20 0948  TempSrc:   PainSc: 0-No pain                 Precious Haws [name]

## 2020-02-24 NOTE — Transfer of Care (Signed)
Immediate Anesthesia Transfer of Care Note  Patient: Jodi Carpenter  Procedure(s) Performed: INTRAUTERINE DEVICE (IUD) REMOVAL Mirena (N/A ) HYSTEROSCOPY LAPAROSCOPY DIAGNOSTIC  Patient Location: PACU  Anesthesia Type:General  Level of Consciousness: sedated  Airway & Oxygen Therapy: Patient Spontanous Breathing and Patient connected to face mask oxygen  Post-op Assessment: Report given to RN and Post -op Vital signs reviewed and stable  Post vital signs: Reviewed and stable  Last Vitals:  Vitals Value Taken Time  BP 115/63 02/24/20 0910  Temp 36.8 C 02/24/20 0910  Pulse 77 02/24/20 0914  Resp    SpO2 100 % 02/24/20 0914  Vitals shown include unvalidated device data.  Last Pain:  Vitals:   02/24/20 0615  TempSrc: Temporal  PainSc: 0-No pain         Complications: No apparent anesthesia complications

## 2020-02-24 NOTE — Op Note (Addendum)
Preoperative Diagnosis: 1) 39 y.o. with retained Mirena IUD 2) Hydrosalpinx 3) Abnormal uterine bleeding  Postoperative Diagnosis: 1) 39 y.o. with with retained Mirena IUD 2) Hydrosalpinx 3) Abnormal uterine bleeding 4) Uterine septum 5) Intramural fibroid  Operation Performed: Hysteroscopy, dilation and curettage  Indication: retained Mirena IUD, AUB, hydrosalpinx on imaging  Anesthesia: General  Primary Surgeon: Malachy Mood, MD  Assistant: Adrian Prows, MD this surgery required a high level surgical assistant with none other readily available  Preoperative Antibiotics: none  Estimated Blood Loss: 5 mL  Urine Output:: ~171mL straight cath  Drains or Tubes: none  Implants: none  Specimens Removed: none  Complications: none  Intraoperative Findings:  IUD string non-visualized, IUD removed using polyp forceps.  The hysteroscopy revealed a septate uterus, Novasure endometrial ablation was not attempted due to concern for inability to ablate the cornua with the device.  Laparoscopy revealed no significant adhesive disease in the upper abdomen.  The posterior cul de sac at the site of the patient's prior J-pouch procedure was completely obliterated with dense adhesions of bowl to the posterior fundus precluding visualization of the left ovary,  The right ovary was and tube was visualized but densely adhered in the posterior cul de sac.  Patient Condition: stable  Procedure in Detail:  Patient was taken to the operating room were she was administered general endotracheal anesthesia.  She was positioned in the dorsal lithotomy position utilizing Allen stirups, prepped and draped in the usual sterile fashion.  Uterus was noted to be non-enlarged in size, anteverted.   Prior to proceeding with the case a time out was performed. Attention was turned to the patient's pelvis.  A red rubber catheter was used to empty the patient's bladder.  An operative speculum was placed to  allow visualization of the cervix.  The anterior lip of the cervix was grasped with a single tooth tenaculum and the cervix was sequentially dilated using pratt dilators.  The hysteroscope was then advanced into the uterine cavity noting the above findings.  The single tooth tenaculum was switched out for a Hulka tenaculum.  Attention was turned to the patient's abdomen.  The umbilicus was infiltrated with 1% Sensorcaine, before making a stab incision using an 11 blade scalpel in the left upper quadrant at Palmer's point.  A orogastric tube had been placed for decompression of the stomach.  A 55mm Excel trocar was then used to gain direct entry into the peritoneal cavity utilizing the camera to visualize progress of the trocar during placement.  Once peritoneal entry had been achieved, insufflation was started and pneumoperitoneum established at a pressure of 15mmHg. Two additional 69mm excel trocars were placed under direct visualization by Dr. Gilman Schmidt in the umbilicus and right lower quadrant.General inspection of the abdomen revealed the above noted findings. Dr. Gilman Schmidt assisted in manipulation of tissue and to offer her opinion on feasibility of taking down any of the bowl adhesions without risking enterotomy.    Pneumoperitoneum was evacuated.  The trocars were removed.  The trocar sites were closed with 4-0 Monocryl in a subcuticular fashion.  All trocar sites were then dressed with surgical skin glue.  The Hulka tenaculum was removed.  Sponge needle and instrument counts were correct time two.  The patient tolerated the procedure well and was taken to the recovery room in stable condition.

## 2020-02-24 NOTE — Anesthesia Procedure Notes (Signed)
Procedure Name: Intubation Date/Time: 02/24/2020 8:01 AM Performed by: Justus Memory, CRNA Pre-anesthesia Checklist: Patient identified, Patient being monitored, Timeout performed, Emergency Drugs available and Suction available Patient Re-evaluated:Patient Re-evaluated prior to induction Oxygen Delivery Method: Circle system utilized Preoxygenation: Pre-oxygenation with 100% oxygen Induction Type: IV induction Ventilation: Mask ventilation without difficulty Laryngoscope Size: 3 and McGraph Grade View: Grade I Tube type: Oral Tube size: 7.0 mm Number of attempts: 1 Airway Equipment and Method: Stylet,  Video-laryngoscopy and Rigid stylet Placement Confirmation: ETT inserted through vocal cords under direct vision,  positive ETCO2 and breath sounds checked- equal and bilateral Secured at: 21 cm Tube secured with: Tape Dental Injury: Teeth and Oropharynx as per pre-operative assessment  Difficulty Due To: Difficulty was anticipated and Difficult Airway- due to anterior larynx Future Recommendations: Recommend- induction with short-acting agent, and alternative techniques readily available

## 2020-02-24 NOTE — Anesthesia Preprocedure Evaluation (Signed)
Anesthesia Evaluation  Patient identified by MRN, date of birth, ID band Patient awake    Reviewed: Allergy & Precautions, H&P , NPO status , Patient's Chart, lab work & pertinent test results  History of Anesthesia Complications Negative for: history of anesthetic complications  Airway Mallampati: I  TM Distance: >3 FB Neck ROM: full    Dental  (+) Chipped   Pulmonary neg shortness of breath, former smoker,           Cardiovascular Exercise Tolerance: Good (-) angina(-) Past MI and (-) DOE negative cardio ROS       Neuro/Psych  Headaches, PSYCHIATRIC DISORDERS    GI/Hepatic Neg liver ROS, PUD, GERD  Medicated and Controlled,  Endo/Other  Hypothyroidism   Renal/GU      Musculoskeletal   Abdominal   Peds  Hematology negative hematology ROS (+)   Anesthesia Other Findings Past Medical History: 03/12/2015: Adenomyosis No date: Anxiety No date: Colitis No date: Depression No date: Headache No date: Hypothyroidism No date: Infertility, female No date: Obesity (BMI 35.0-39.9 without comorbidity) 2012: Thyroid disease     Comment:  Hashimoto's thyroiditis/ hypothyroidism  Past Surgical History: No date: COLON SURGERY 05/23/2010: LEFT COLECTOMY     Comment:  sigmoid colectomy with J pouch No date: MASS EXCISION     Comment:  pilonidal cyst and abscess No date: OTHER SURGICAL HISTORY     Comment:  illeionidal anal stenosis  10/03/2018: RECTAL EXAM UNDER ANESTHESIA; N/A     Comment:  Procedure: RECTAL EXAM UNDER ANESTHESIA;  Surgeon:               Olean Ree, MD;  Location: [facility] ORS;  Service:               General;  Laterality: N/A; No date: TONSILLECTOMY  BMI    Body Mass Index: 35.19 kg/m      Reproductive/Obstetrics negative OB ROS                             Anesthesia Physical Anesthesia Plan  ASA: III  Anesthesia Plan: General ETT   Post-op Pain Management:     Induction: Intravenous  PONV Risk Score and Plan: Ondansetron, Dexamethasone, Midazolam and Treatment may vary due to age or medical condition  Airway Management Planned: Oral ETT  Additional Equipment:   Intra-op Plan:   Post-operative Plan: Extubation in OR  Informed Consent: I have reviewed the patients History and Physical, chart, labs and discussed the procedure including the risks, benefits and alternatives for the proposed anesthesia with the patient or authorized representative who has indicated his/her understanding and acceptance.     Dental Advisory Given  Plan Discussed with: Anesthesiologist, CRNA and Surgeon  Anesthesia Plan Comments: (Patient consented for risks of anesthesia including but not limited to:  - adverse reactions to medications - damage to teeth, lips or other oral mucosa - sore throat or hoarseness - Damage to heart, brain, lungs or loss of life  Patient voiced understanding.)        Anesthesia Quick Evaluation

## 2020-02-24 NOTE — OR Nursing (Signed)
IUD removed, device intact. Disposed of in biohazard bin per MD.

## 2020-02-24 NOTE — H&P (Signed)
Date of Initial H&P: 02/17/2020  History reviewed, patient examined, no change in status, stable for surgery.

## 2020-02-25 DIAGNOSIS — T8339XA Other mechanical complication of intrauterine contraceptive device, initial encounter: Secondary | ICD-10-CM

## 2020-02-25 DIAGNOSIS — N7011 Chronic salpingitis: Secondary | ICD-10-CM

## 2020-02-25 DIAGNOSIS — Q5128 Other doubling of uterus, other specified: Secondary | ICD-10-CM

## 2020-02-25 DIAGNOSIS — N736 Female pelvic peritoneal adhesions (postinfective): Secondary | ICD-10-CM

## 2020-03-06 ENCOUNTER — Other Ambulatory Visit: Payer: Self-pay | Admitting: Physician Assistant

## 2020-03-06 DIAGNOSIS — E038 Other specified hypothyroidism: Secondary | ICD-10-CM

## 2020-03-06 NOTE — Telephone Encounter (Signed)
Requested Prescriptions  Pending Prescriptions Disp Refills  . levothyroxine (SYNTHROID) 125 MCG tablet [Pharmacy Med Name: LEVOTHYROXINE 125 MCG TABLET] 90 tablet 1    Sig: TAKE 1 TABLET (125 MCG TOTAL) BY MOUTH DAILY BEFORE BREAKFAST.     Endocrinology:  Hypothyroid Agents Failed - 03/06/2020  9:11 AM      Failed - TSH needs to be rechecked within 3 months after an abnormal result. Refill until TSH is due.      Passed - TSH in normal range and within 360 days    TSH  Date Value Ref Range Status  12/03/2019 2.670 0.450 - 4.500 uIU/mL Final         Passed - Valid encounter within last 12 months    Recent Outpatient Visits          3 months ago Encounter for annual physical exam   [facility] Jerrol Banana., MD   7 months ago Adult hypothyroidism   [facility] Jerrol Banana., MD   1 year ago Hemorrhoidal skin tags   [facility] Jerrol Banana., MD   2 years ago Flu-like symptoms   [place], [place], [place]   2 years ago Acute recurrent maxillary sinusitis   [place], [place]

## 2020-03-09 ENCOUNTER — Ambulatory Visit (INDEPENDENT_AMBULATORY_CARE_PROVIDER_SITE_OTHER): Payer: Managed Care, Other (non HMO) | Admitting: Obstetrics and Gynecology

## 2020-03-09 ENCOUNTER — Encounter: Payer: Self-pay | Admitting: Obstetrics and Gynecology

## 2020-03-09 ENCOUNTER — Other Ambulatory Visit: Payer: Self-pay

## 2020-03-09 VITALS — BP 102/70 | Ht 65.0 in | Wt 209.0 lb

## 2020-03-09 DIAGNOSIS — N938 Other specified abnormal uterine and vaginal bleeding: Secondary | ICD-10-CM

## 2020-03-09 DIAGNOSIS — Z4889 Encounter for other specified surgical aftercare: Secondary | ICD-10-CM | POA: Diagnosis not present

## 2020-03-10 NOTE — Progress Notes (Signed)
      Postoperative Follow-up Patient presents post op from hysteroscopy, diagnostic laparoscopy 2weeks ago for abnormal uterine bleeding.  Subjective: Patient reports some improvement in her preop symptoms. Eating a regular diet without difficulty. The patient is not having any pain.  Activity: normal activities of daily living.  Objective: Blood pressure 102/70, height 5\' 5"  (1.651 m), weight 209 lb (94.8 kg).  General: NAD Pulmonary: no increased work of breathing Abdomen: soft, non-tender, non-distended, incision(s) D/C/I Extremities: no edema Neurologic: normal gait   Admission on 02/24/2020, Discharged on 02/24/2020  Component Date Value Ref Range Status  . ABO/RH(D) 02/24/2020    Final                   Value:A POS Performed at [facility], [address], [place], [place] [address]   . Preg Test, Ur 02/24/2020 NEGATIVE  NEGATIVE Final   Comment:        THE SENSITIVITY OF THIS METHODOLOGY IS >24 mIU/mL     Assessment: 39 y.o. s/p hysteroscopy, diagnostic laparoscopy stable  Plan: Patient has done well after surgery with no apparent complications.  I have discussed the post-operative course to date, and the expected progress moving forward.  The patient understands what complications to be concerned about.  I will see the patient in routine follow up, or sooner if needed.    Activity plan: No restriction.  Discussed option for AUB given uterine septum.  Hydrothermal ablation would be an option at a center that offers this.  I feel that given the septum an IUD is at increased risk of expulsion and malposition.  Hormonal management with norethindrone is also an option.  Based on appearance the fallopian tubes are non-functioning and occluded.  Removal of the fallopian tubes given the extensive small bowl adhesions seen on laparoscopy stands a high risk of inadvertent enterotomy or need for small bowl resection.  The chance of spontaneous conception is extremely  unlikely.  Hormonal management of the patient AUB would also achieve additional contraceptive benefit.   Malachy Mood, MD, [facility], [facility] 03/10/2020, 8:31 AM

## 2020-04-09 ENCOUNTER — Ambulatory Visit (INDEPENDENT_AMBULATORY_CARE_PROVIDER_SITE_OTHER): Payer: Managed Care, Other (non HMO) | Admitting: Obstetrics and Gynecology

## 2020-04-09 ENCOUNTER — Encounter: Payer: Self-pay | Admitting: Obstetrics and Gynecology

## 2020-04-09 ENCOUNTER — Other Ambulatory Visit: Payer: Self-pay

## 2020-04-09 VITALS — BP 110/76 | Ht 65.0 in | Wt 199.0 lb

## 2020-04-09 DIAGNOSIS — Q5128 Other doubling of uterus, other specified: Secondary | ICD-10-CM

## 2020-04-09 DIAGNOSIS — Z4889 Encounter for other specified surgical aftercare: Secondary | ICD-10-CM

## 2020-04-09 DIAGNOSIS — Q51810 Arcuate uterus: Secondary | ICD-10-CM

## 2020-04-09 NOTE — Progress Notes (Signed)
      Postoperative Follow-up Patient presents post op from hysteroscopy, diagnostic laparoscopy, Mirena IUD removal 6weeks ago for retained IUD, adnexal mass (hydrosalpinx), AUB.  Subjective: Patient reports marked improvement in her preop symptoms. Eating a regular diet without difficulty. The patient is not having any pain.  Activity: normal activities of daily living.  Has had first menstrual cycle which was normal in length and flow.  Objective: Blood pressure 110/76, height 5\' 5"  (1.651 m), weight 199 lb (90.3 kg), last menstrual period 03/18/2020.  Admission on 02/24/2020, Discharged on 02/24/2020  Component Date Value Ref Range Status  . ABO/RH(D) 02/24/2020    Final                   Value:A POS Performed at [facility], [address], [place], [place] [address]   . Preg Test, Ur 02/24/2020 NEGATIVE  NEGATIVE Final   Comment:        THE SENSITIVITY OF THIS METHODOLOGY IS >24 mIU/mL     Assessment: 39 y.o. s/p hysteroscopy, diagnostic laparoscopy, Mirena IUD removal stable  Plan: Patient has done well after surgery with no apparent complications.  I have discussed the post-operative course to date, and the expected progress moving forward.  The patient understands what complications to be concerned about.  I will see the patient in routine follow up, or sooner if needed.    Activity plan: No restriction.   Malachy Mood, MD, [facility], [facility] 04/09/2020, 8:24 AM

## 2020-04-13 ENCOUNTER — Ambulatory Visit (INDEPENDENT_AMBULATORY_CARE_PROVIDER_SITE_OTHER): Payer: Managed Care, Other (non HMO) | Admitting: Dermatology

## 2020-04-13 ENCOUNTER — Other Ambulatory Visit: Payer: Self-pay

## 2020-04-13 DIAGNOSIS — L988 Other specified disorders of the skin and subcutaneous tissue: Secondary | ICD-10-CM

## 2020-04-13 NOTE — Progress Notes (Signed)
   Follow-Up Visit   Subjective  Jodi Carpenter is a 39 y.o. female who presents for the following: Facial Elastosis (face, pt presents for Botox today, last botox txt 12/2018).   The following portions of the chart were reviewed this encounter and updated as appropriate:  Tobacco  Allergies  Meds  Problems  Med Hx  Surg Hx  Fam Hx      Review of Systems:  No other skin or systemic complaints except as noted in HPI or Assessment and Plan.  Objective  Well appearing patient in no apparent distress; mood and affect are within normal limits.  A focused examination was performed including face. Relevant physical exam findings are noted in the Assessment and Plan.  Objective  Face: Rhytides and volume loss.     Images                   Assessment & Plan  Elastosis of skin Face  Botox 37.5 units injected as marked: -Frown Complex 27.5 units -Brow lift 2.5 units each side -Forehead 5 units  Intralesional injection - Face Location: Frown complex, Brow lifts, Forehead  Informed consent: Discussed risks (infection, pain, bleeding, bruising, swelling, allergic reaction, paralysis of nearby muscles, eyelid droop, double vision, neck weakness, difficulty breathing, headache, undesirable cosmetic result, and need for additional treatment) and benefits of the procedure, as well as the alternatives.  Informed consent was obtained.  Preparation: The area was cleansed with alcohol.  Procedure Details:  Botox was injected into the dermis with a 30-gauge needle. Pressure applied to any bleeding. Ice packs offered for swelling.  Lot Number:  [id number] Expiration:  05/2022  Total Units Injected:  37.5  Plan: Patient was instructed to remain upright for 4 hours. Patient was instructed to avoid massaging the face and avoid vigorous exercise for the rest of the day. Tylenol may be used for headache.  Allow 2 weeks before returning to clinic for additional dosing as  needed. Patient will call for any problems.   Return for 3-4 months Botox.  I, Othelia Pulling, RMA, am acting as scribe for Sarina Ser, MD .  Documentation: I have reviewed the above documentation for accuracy and completeness, and I agree with the above.  Sarina Ser, MD

## 2020-04-14 ENCOUNTER — Encounter: Payer: Self-pay | Admitting: Dermatology

## 2020-07-16 ENCOUNTER — Other Ambulatory Visit: Payer: Self-pay | Admitting: Family Medicine

## 2020-07-16 DIAGNOSIS — F419 Anxiety disorder, unspecified: Secondary | ICD-10-CM

## 2020-07-16 NOTE — Telephone Encounter (Signed)
Requested medication (s) are due for refill today: yes  Requested medication (s) are on the active medication list: yes  Last refill:  11/26/19 #45 with 2 refills  Future visit scheduled: yes  Notes to clinic:  Please review for refill. Refill not delegated per protocol.    Requested Prescriptions  Pending Prescriptions Disp Refills   ALPRAZolam (XANAX) 0.5 MG tablet [Pharmacy Med Name: ALPRAZOLAM 0.5 MG TABLET] 45 tablet     Sig: Take 1 tablet (0.5 mg total) by mouth 2 (two) times daily as needed for anxiety.      Not Delegated - Psychiatry:  Anxiolytics/Hypnotics Failed - 07/16/2020 12:34 PM      Failed - This refill cannot be delegated      Failed - Urine Drug Screen completed in last 360 days.      Failed - Valid encounter within last 6 months    Recent Outpatient Visits           7 months ago Encounter for annual physical exam   [facility] Jerrol Banana., MD   12 months ago Adult hypothyroidism   [facility] Jerrol Banana., MD   1 year ago Hemorrhoidal skin tags   [facility] Jerrol Banana., MD   2 years ago Flu-like symptoms   [facility] [place] [facility], [place]   2 years ago Acute recurrent maxillary sinusitis   [facility] [place], [place]       Future Appointments             In 4 months Jerrol Banana., MD [place], [place]

## 2020-08-05 ENCOUNTER — Other Ambulatory Visit: Payer: Self-pay

## 2020-08-05 ENCOUNTER — Ambulatory Visit (INDEPENDENT_AMBULATORY_CARE_PROVIDER_SITE_OTHER): Payer: Managed Care, Other (non HMO) | Admitting: Dermatology

## 2020-08-05 DIAGNOSIS — L988 Other specified disorders of the skin and subcutaneous tissue: Secondary | ICD-10-CM

## 2020-08-05 NOTE — Progress Notes (Signed)
   Follow-Up Visit   Subjective  Jodi Carpenter is a 39 y.o. female who presents for the following: Facial Elastosis.  Patient here today for Botox injections.  She did not have complete resolution of 11s after previous treatment so will slightly increase dose.   The following portions of the chart were reviewed this encounter and updated as appropriate:  Tobacco  Allergies  Meds  Problems  Med Hx  Surg Hx  Fam Hx      Review of Systems:  No other skin or systemic complaints except as noted in HPI or Assessment and Plan.  Objective  Well appearing patient in no apparent distress; mood and affect are within normal limits.  A focused examination was performed including face. Relevant physical exam findings are noted in the Assessment and Plan.  Objective  face: Rhytides and volume loss.   Images     Assessment & Plan  Elastosis of skin face  Botox Injection - face Location: See attached image  Informed consent: Discussed risks (infection, pain, bleeding, bruising, swelling, allergic reaction, paralysis of nearby muscles, eyelid droop, double vision, neck weakness, difficulty breathing, headache, undesirable cosmetic result, and need for additional treatment) and benefits of the procedure, as well as the alternatives.  Informed consent was obtained.  Preparation: The area was cleansed with alcohol.  Procedure Details:  Botox was injected into the dermis with a 30-gauge needle. Pressure applied to any bleeding. Ice packs offered for swelling.  Lot Number:  [id number] Expiration:  09/2022  Total Units Injected:  38  Plan: Patient was instructed to remain upright for 4 hours. Patient was instructed to avoid massaging the face and avoid vigorous exercise for the rest of the day. Tylenol may be used for headache.  Allow 2 weeks before returning to clinic for additional dosing as needed. Patient will call for any problems.   Return in about 4 months (around  12/05/2020) for Botox.  Jodi Carpenter, RMA, am acting as scribe for Forest Gleason, MD .   Documentation: I have reviewed the above documentation for accuracy and completeness, and I agree with the above.  Forest Gleason, MD

## 2020-08-23 ENCOUNTER — Encounter: Payer: Self-pay | Admitting: Dermatology

## 2020-10-14 ENCOUNTER — Other Ambulatory Visit: Payer: Self-pay | Admitting: Family Medicine

## 2020-10-14 DIAGNOSIS — E038 Other specified hypothyroidism: Secondary | ICD-10-CM

## 2020-11-09 ENCOUNTER — Ambulatory Visit: Payer: Managed Care, Other (non HMO) | Admitting: Dermatology

## 2020-11-17 ENCOUNTER — Ambulatory Visit: Payer: Managed Care, Other (non HMO) | Admitting: Dermatology

## 2020-11-25 ENCOUNTER — Encounter: Payer: Self-pay | Admitting: Family Medicine

## 2021-01-19 ENCOUNTER — Other Ambulatory Visit: Payer: Self-pay | Admitting: Family Medicine

## 2021-01-19 DIAGNOSIS — E038 Other specified hypothyroidism: Secondary | ICD-10-CM

## 2021-02-03 ENCOUNTER — Other Ambulatory Visit: Payer: Self-pay | Admitting: Family Medicine

## 2021-02-03 DIAGNOSIS — E038 Other specified hypothyroidism: Secondary | ICD-10-CM

## 2021-02-08 ENCOUNTER — Encounter: Payer: Self-pay | Admitting: Family Medicine

## 2021-02-08 ENCOUNTER — Other Ambulatory Visit: Payer: Self-pay

## 2021-02-08 ENCOUNTER — Ambulatory Visit (INDEPENDENT_AMBULATORY_CARE_PROVIDER_SITE_OTHER): Payer: Managed Care, Other (non HMO) | Admitting: Family Medicine

## 2021-02-08 VITALS — BP 122/77 | HR 91 | Temp 98.9°F | Resp 16 | Ht 65.0 in | Wt 235.0 lb

## 2021-02-08 DIAGNOSIS — Z0000 Encounter for general adult medical examination without abnormal findings: Secondary | ICD-10-CM

## 2021-02-08 DIAGNOSIS — E039 Hypothyroidism, unspecified: Secondary | ICD-10-CM | POA: Diagnosis not present

## 2021-02-08 DIAGNOSIS — Z1389 Encounter for screening for other disorder: Secondary | ICD-10-CM

## 2021-02-08 DIAGNOSIS — K51919 Ulcerative colitis, unspecified with unspecified complications: Secondary | ICD-10-CM

## 2021-02-08 LAB — POCT URINALYSIS DIPSTICK
Bilirubin, UA: NEGATIVE
Glucose, UA: NEGATIVE
Ketones, UA: NEGATIVE
Leukocytes, UA: NEGATIVE
Nitrite, UA: NEGATIVE
Protein, UA: NEGATIVE
Spec Grav, UA: 1.02 (ref 1.010–1.025)
Urobilinogen, UA: 0.2 E.U./dL
pH, UA: 6 (ref 5.0–8.0)

## 2021-02-08 NOTE — Progress Notes (Signed)
   I,April Miller,acting as a scribe for   Jr, MD.,have documented all relevant documentation on the behalf of   Jr, MD,as directed by    Jr, MD while in the presence of   Jr, MD.  Complete physical exam   Patient: Jodi Carpenter   DOB: 03/27/1981   40 y.o. Female  MRN: [id number] Visit Date: 02/08/2021  Today's healthcare provider:   Jr, MD   Chief Complaint  Patient presents with  . Annual Exam   Subjective    Jodi Carpenter is a 40 y.o. female who presents today for a complete physical exam.  She reports consuming a patient does pre-packaged meals diet. Home exercise routine includes walking. She generally feels well. She reports sleeping fairly well. She does not have additional problems to discuss today.  She remarried in 2021 it is very happy.  She exercises 5-7 times per week but is gaining weight due to dietary indiscretion since she is remarried.  She is getting ready to start Optivia diet plan. HPI    Past Medical History:  Diagnosis Date  . Adenomyosis 03/12/2015  . Anxiety   . Colitis   . Depression   . Headache   . Hypothyroidism   . Infertility, female   . Obesity (BMI 35.0-39.9 without comorbidity)   . Thyroid disease 2012   Hashimoto's thyroiditis/ hypothyroidism   Past Surgical History:  Procedure Laterality Date  . COLON SURGERY    . HYSTEROSCOPY  02/24/2020   Procedure: HYSTEROSCOPY;  Surgeon: Staebler, Andreas, MD;  Location: [facility];  Service: Gynecology;;  . IUD REMOVAL N/A 02/24/2020   Procedure: INTRAUTERINE DEVICE (IUD) REMOVAL Mirena;  Surgeon: Staebler, Andreas, MD;  Location: [facility];  Service: Gynecology;  Laterality: N/A;  . LAPAROSCOPY  02/24/2020   Procedure: LAPAROSCOPY DIAGNOSTIC;  Surgeon: Staebler, Andreas, MD;  Location: [facility];  Service: Gynecology;;  . LEFT COLECTOMY  05/23/2010   sigmoid colectomy with J pouch  . MASS EXCISION     pilonidal cyst  and abscess  . OTHER SURGICAL HISTORY     illeionidal anal stenosis   . RECTAL EXAM UNDER ANESTHESIA N/A 10/03/2018   Procedure: RECTAL EXAM UNDER ANESTHESIA;  Surgeon: Piscoya, Jose, MD;  Location: [facility];  Service: General;  Laterality: N/A;  . TONSILLECTOMY     Social History   Socioeconomic History  . Marital status: Married    Spouse name: Not on file  . Number of children: 0  . Years of education: 16  . Highest education level: Not on file  Occupational History  . Occupation: [employer]  Tobacco Use  . Smoking status: Former Smoker    Packs/day: 0.25    Years: 8.00    Pack years: 2.00    Types: Cigarettes    Quit date: 09/27/2004    Years since quitting: 16.3  . Smokeless tobacco: Never Used  Vaping Use  . Vaping Use: Never used  Substance and Sexual Activity  . Alcohol use: Yes    Comment: 1-2 a month  . Drug use: No  . Sexual activity: Yes    Birth control/protection: I.U.D.  Other Topics Concern  . Not on file  Social History Narrative  . Not on file   Social Determinants of Health   Financial Resource Strain: Not on file  Food Insecurity: Not on file  Transportation Needs: Not on file  Physical Activity: Not on file  Stress: Not on file  Social Connections: Not on file  Intimate   Partner Violence: Not on file   Family Status  Relation Name Status  . Mother  Alive  . Father  Alive  . Sister  Alive  . Sister  Alive  . MGF  Deceased  . MGM  Alive  . PGM  Alive  . PGF  Alive   Family History  Problem Relation Age of Onset  . Hypertension Mother   . Panic disorder Mother   . Hypertension Father   . Diverticulosis Father   . Prostate cancer Maternal Grandfather    Allergies  Allergen Reactions  . Cortisporin  [Neomycin-Polymyxin-Hc] Swelling    Patient Care Team: Jerrol Banana., MD as PCP - General (Family Medicine)   Medications: Outpatient Medications Prior to Visit  Medication Sig  . ALPRAZolam (XANAX) 0.5 MG tablet  TAKE 1 TABLET (0.5 MG TOTAL) BY MOUTH 2 (TWO) TIMES DAILY AS NEEDED FOR ANXIETY.  Marland Kitchen levothyroxine (SYNTHROID) 125 MCG tablet TAKE 1 TABLET (125 MCG TOTAL) BY MOUTH DAILY BEFORE BREAKFAST  . Multiple Vitamins-Minerals (MULTIVITAMIN ADULT PO) Take 1 tablet by mouth daily.  Marland Kitchen tretinoin (RETIN-A) 0.025 % cream Apply 1 application topically 3 (three) times a week.   No facility-administered medications prior to visit.    Review of Systems  All other systems reviewed and are negative.      Objective    BP 122/77 (BP Location: Left Arm, Patient Position: Sitting, Cuff Size: Large)   Pulse 91   Temp 98.9 F (37.2 C) (Oral)   Resp 16   Ht 5' 5" (1.651 m)   Wt 235 lb (106.6 kg)   LMP 01/24/2021   SpO2 97%   BMI 39.11 kg/m  Wt Readings from Last 3 Encounters:  02/08/21 235 lb (106.6 kg)  04/09/20 199 lb (90.3 kg)  03/09/20 209 lb (94.8 kg)      Physical Exam Vitals reviewed.  Constitutional:      Appearance: She is obese.  HENT:     Head: Normocephalic and atraumatic.     Right Ear: External ear normal.     Left Ear: External ear normal.  Eyes:     General: No scleral icterus.    Conjunctiva/sclera: Conjunctivae normal.  Cardiovascular:     Rate and Rhythm: Normal rate and regular rhythm.     Pulses: Normal pulses.     Heart sounds: Normal heart sounds.  Pulmonary:     Effort: Pulmonary effort is normal.     Breath sounds: Normal breath sounds.  Abdominal:     Palpations: Abdomen is soft.  Lymphadenopathy:     Cervical: No cervical adenopathy.  Skin:    General: Skin is warm and dry.  Neurological:     General: No focal deficit present.     Mental Status: She is alert and oriented to person, place, and time.  Psychiatric:        Mood and Affect: Mood normal.        Behavior: Behavior normal.        Thought Content: Thought content normal.        Judgment: Judgment normal.       Last depression screening scores PHQ 2/9 Scores 02/08/2021 11/26/2019 05/08/2017   PHQ - 2 Score 1 0 0  PHQ- 9 Score 5 0 6   Last fall risk screening Fall Risk  11/26/2019  Falls in the past year? 0  Number falls in past yr: 0  Injury with Fall? 0  Follow up Falls evaluation completed  Last Audit-C alcohol use screening Alcohol Use Disorder Test (AUDIT) 02/08/2021  1. How often do you have a drink containing alcohol? -  2. How many drinks containing alcohol do you have on a typical day when you are drinking? 1  3. How often do you have six or more drinks on one occasion? 0  AUDIT-C Score -  Alcohol Brief Interventions/Follow-up AUDIT Score <7 follow-up not indicated   A score of 3 or more in women, and 4 or more in men indicates increased risk for alcohol abuse, EXCEPT if all of the points are from question 1   Results for orders placed or performed in visit on 02/08/21  POCT urinalysis dipstick  Result Value Ref Range   Color, UA Dark Yellow    Clarity, UA Slightly Cloudy    Glucose, UA Negative Negative   Bilirubin, UA Negative    Ketones, UA Negative    Spec Grav, UA 1.020 1.010 - 1.025   Blood, UA Moderate    pH, UA 6.0 5.0 - 8.0   Protein, UA Negative Negative   Urobilinogen, UA 0.2 0.2 or 1.0 E.U./dL   Nitrite, UA Negative    Leukocytes, UA Negative Negative    Assessment & Plan    Routine Health Maintenance and Physical Exam  Exercise Activities and Dietary recommendations Goals   None     Immunization History  Administered Date(s) Administered  . Hepatitis B, adult 01/08/1997, 06/16/1997  . Influenza,inj,Quad PF,6+ Mos 09/09/2018  . Td 01/08/1997, 05/19/1999  . Tdap 11/26/2019    Health Maintenance  Topic Date Due  . Hepatitis C Screening  Never done  . COVID-19 Vaccine (1) Never done  . HIV Screening  Never done  . INFLUENZA VACCINE  06/27/2020  . PAP SMEAR-Modifier  12/21/2022  . TETANUS/TDAP  11/25/2029  . HPV VACCINES  Aged Out    Discussed health benefits of physical activity, and encouraged her to engage in regular  exercise appropriate for her age and condition.  1. Annual physical exam Patient now status post hysterectomy and status post gastric pouch weight loss surgery. Well woman exam per GYN in the next month or 2. We discussed dietary changes for healthy weight loss at length.  She is ready to work on these changes. - Lipid panel - TSH - CBC w/Diff/Platelet - Comprehensive Metabolic Panel (CMET)  2. Adult hypothyroidism   3. Screening for blood or protein in urine  - POCT urinalysis dipstick--Negative  4. Ulcerative colitis with complication, unspecified location (HCC)  - Sed Rate (ESR) - Ambulatory referral to Gastroenterology   Return in about 1 year (around 02/08/2022).     I,   Jr, MD, have reviewed all documentation for this visit. The documentation on 02/09/21 for the exam, diagnosis, procedures, and orders are all accurate and complete.      Jr, MD  [facility] [phone] (phone) [phone] (fax)  [facility] 

## 2021-02-15 LAB — CBC WITH DIFFERENTIAL/PLATELET
Basophils Absolute: 0.1 10*3/uL (ref 0.0–0.2)
Basos: 1 %
EOS (ABSOLUTE): 0.4 10*3/uL (ref 0.0–0.4)
Eos: 5 %
Hematocrit: 42.3 % (ref 34.0–46.6)
Hemoglobin: 14 g/dL (ref 11.1–15.9)
Immature Grans (Abs): 0 10*3/uL (ref 0.0–0.1)
Immature Granulocytes: 0 %
Lymphocytes Absolute: 2.5 10*3/uL (ref 0.7–3.1)
Lymphs: 28 %
MCH: 29.9 pg (ref 26.6–33.0)
MCHC: 33.1 g/dL (ref 31.5–35.7)
MCV: 90 fL (ref 79–97)
Monocytes Absolute: 0.7 10*3/uL (ref 0.1–0.9)
Monocytes: 8 %
Neutrophils Absolute: 5.4 10*3/uL (ref 1.4–7.0)
Neutrophils: 58 %
Platelets: 269 10*3/uL (ref 150–450)
RBC: 4.68 x10E6/uL (ref 3.77–5.28)
RDW: 12.5 % (ref 11.7–15.4)
WBC: 9.1 10*3/uL (ref 3.4–10.8)

## 2021-02-15 LAB — SEDIMENTATION RATE: Sed Rate: 6 mm/hr (ref 0–32)

## 2021-02-15 LAB — COMPREHENSIVE METABOLIC PANEL
ALT: 20 IU/L (ref 0–32)
AST: 18 IU/L (ref 0–40)
Albumin/Globulin Ratio: 1.5 (ref 1.2–2.2)
Albumin: 4 g/dL (ref 3.8–4.8)
Alkaline Phosphatase: 65 IU/L (ref 44–121)
BUN/Creatinine Ratio: 18 (ref 9–23)
BUN: 15 mg/dL (ref 6–24)
Bilirubin Total: <0.2 mg/dL (ref 0.0–1.2)
CO2: 22 mmol/L (ref 20–29)
Calcium: 9.3 mg/dL (ref 8.7–10.2)
Chloride: 102 mmol/L (ref 96–106)
Creatinine, Ser: 0.84 mg/dL (ref 0.57–1.00)
Globulin, Total: 2.7 g/dL (ref 1.5–4.5)
Glucose: 90 mg/dL (ref 65–99)
Potassium: 5.3 mmol/L — ABNORMAL HIGH (ref 3.5–5.2)
Sodium: 138 mmol/L (ref 134–144)
Total Protein: 6.7 g/dL (ref 6.0–8.5)
eGFR: 90 mL/min/{1.73_m2} (ref >59)

## 2021-02-15 LAB — LIPID PANEL
Chol/HDL Ratio: 3 ratio (ref 0.0–4.4)
Cholesterol, Total: 176 mg/dL (ref 100–199)
HDL: 58 mg/dL (ref >39)
LDL Chol Calc (NIH): 100 mg/dL — ABNORMAL HIGH (ref 0–99)
Triglycerides: 98 mg/dL (ref 0–149)
VLDL Cholesterol Cal: 18 mg/dL (ref 5–40)

## 2021-02-15 LAB — TSH: TSH: 1.49 u[IU]/mL (ref 0.450–4.500)

## 2021-04-15 ENCOUNTER — Other Ambulatory Visit: Payer: Self-pay | Admitting: Family Medicine

## 2021-04-15 DIAGNOSIS — E038 Other specified hypothyroidism: Secondary | ICD-10-CM

## 2021-05-20 ENCOUNTER — Telehealth (INDEPENDENT_AMBULATORY_CARE_PROVIDER_SITE_OTHER): Payer: Managed Care, Other (non HMO) | Admitting: Family Medicine

## 2021-05-20 ENCOUNTER — Encounter: Payer: Self-pay | Admitting: Family Medicine

## 2021-05-20 VITALS — BP 114/74 | HR 54 | Temp 98.9°F | Wt 212.0 lb

## 2021-05-20 DIAGNOSIS — J0140 Acute pansinusitis, unspecified: Secondary | ICD-10-CM

## 2021-05-20 MED ORDER — AMOXICILLIN-POT CLAVULANATE 875-125 MG PO TABS: 1.0000 | ORAL_TABLET | Freq: Two times a day (BID) | ORAL | 0 refills | Status: AC

## 2021-05-20 NOTE — Progress Notes (Signed)
MyChart Video Visit    Virtual Visit via Video Note   This visit type was conducted due to national recommendations for restrictions regarding the COVID-19 Pandemic (e.g. social distancing) in an effort to limit this patient's exposure and mitigate transmission in our community. This patient is at least at moderate risk for complications without adequate follow up. This format is felt to be most appropriate for this patient at this time. Physical exam was limited by quality of the video and audio technology used for the visit.    Patient location: home Provider location: [place] involved in the visit: patient, provider  I discussed the limitations of evaluation and management by telemedicine and the availability of in person appointments. The patient expressed understanding and agreed to proceed.  Patient: Jodi Carpenter   DOB: February 18, 1981   40 y.o. Female  MRN: [id number] Visit Date: 05/20/2021  Today's healthcare provider: Lavon Paganini, MD   Chief Complaint  Patient presents with   Sinus Problem   Subjective    Sinus Problem This is a new problem. The current episode started in the past 7 days. The problem is unchanged. There has been no fever. Associated symptoms include congestion, coughing (productive of greeen sputum), sinus pressure, sneezing and a sore throat. Pertinent negatives include no chills, diaphoresis, ear pain, headaches, hoarse voice, neck pain, shortness of breath or swollen glands. Treatments tried: Dayquil, Nyquil and Zyrtec. The treatment provided mild relief.    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[place]) 97/12/6376   Contraception management 10/18/2018   Fistula-in-ano    Anal fissure    Hemorrhoidal skin tags    Ulcerative colitis  ([place]) 04/24/2017   Hashimoto's thyroiditis 04/24/2017   Anxiety 06/22/2015   Affective disorder, major 06/22/2015   Adult hypothyroidism 06/22/2015   Menorrhagia with regular cycle 06/22/2015   Adiposity 06/22/2015   Past Medical History:  Diagnosis Date   Adenomyosis 03/12/2015   Anxiety    Colitis    Depression    Headache    Hypothyroidism    Infertility, female    Obesity (BMI 35.0-39.9 without comorbidity)    Thyroid disease 2012   Hashimoto's thyroiditis/ hypothyroidism   Allergies  Allergen Reactions   Cortisporin  [Neomycin-Polymyxin-Hc] Swelling      Medications: Outpatient Medications Prior to Visit  Medication Sig   ALPRAZolam (XANAX) 0.5 MG tablet TAKE 1 TABLET (0.5 MG TOTAL) BY MOUTH 2 (TWO) TIMES DAILY AS NEEDED FOR ANXIETY.   levothyroxine (SYNTHROID) 125 MCG tablet TAKE 1 TABLET BY MOUTH DAILY BEFORE BREAKFAST.   Multiple Vitamins-Minerals (MULTIVITAMIN ADULT PO) Take 1 tablet by mouth daily.   tretinoin (RETIN-A) 0.025 % cream Apply 1 application topically 3 (three) times a week.   No facility-administered medications prior to visit.    Review of Systems  Constitutional:  Negative for chills and diaphoresis.  HENT:  Positive for congestion, rhinorrhea, sinus pressure, sneezing and sore throat. Negative for ear pain and hoarse voice.   Eyes:        Patient reports watery eyes  Respiratory:  Positive for cough (productive of greeen sputum). Negative for shortness of breath.   Musculoskeletal:  Negative for neck pain.  Neurological:  Negative for headaches.     Objective    BP 114/74  Pulse (!) 54   Temp 98.9 F (37.2 C) (Oral)   Wt 212 lb (96.2 kg) Comment: patient reports  LMP 05/04/2021 (Exact Date)   BMI 35.28 kg/m    Physical Exam Constitutional:      Appearance: Normal appearance.  HENT:     Head: Normocephalic and atraumatic.  Pulmonary:     Effort: Pulmonary effort is normal. No respiratory distress.  Neurological:     Mental  Status: She is alert and oriented to person, place, and time.       Assessment & Plan     1. Acute non-recurrent pansinusitis - symptoms and exam c/w sinusitis   - no evidence of AOM, CAP, strep pharyngitis, or other infection - given duration of symptoms, suspect bacterial etiology - will treat with Augmentin x7d - discussed symptomatic management (flonase, decongestants, etc), natural course, and return precautions    No follow-ups on file.     I discussed the assessment and treatment plan with the patient. The patient was provided an opportunity to ask questions and all were answered. The patient agreed with the plan and demonstrated an understanding of the instructions.   The patient was advised to call back or seek an in-person evaluation if the symptoms worsen or if the condition fails to improve as anticipated.  I, Lavon Paganini, MD, have reviewed all documentation for this visit. The documentation on 05/20/21 for the exam, diagnosis, procedures, and orders are all accurate and complete.   Gurtej Noyola, Dionne Bucy, MD, MPH [facility]

## 2021-05-20 NOTE — Patient Instructions (Signed)

## 2021-11-15 ENCOUNTER — Telehealth: Payer: Self-pay | Admitting: Obstetrics and Gynecology

## 2021-11-15 NOTE — Telephone Encounter (Signed)
Patient is needing to get a mammogram set up. This will be the first one. Patient is not experiencing any symptoms or concerns. Patient would like to have order put in to go to [facility].   CB# [phone]

## 2021-12-06 ENCOUNTER — Other Ambulatory Visit: Payer: Self-pay

## 2021-12-06 ENCOUNTER — Other Ambulatory Visit (HOSPITAL_COMMUNITY)
Admission: RE | Admit: 2021-12-06 | Discharge: 2021-12-06 | Disposition: A | Discharge status: Home / Self Care | Payer: Managed Care, Other (non HMO) | Source: Ambulatory Visit | Attending: Obstetrics and Gynecology | Admitting: Obstetrics and Gynecology

## 2021-12-06 ENCOUNTER — Encounter: Payer: Self-pay | Admitting: Obstetrics and Gynecology

## 2021-12-06 ENCOUNTER — Ambulatory Visit (INDEPENDENT_AMBULATORY_CARE_PROVIDER_SITE_OTHER): Payer: Managed Care, Other (non HMO) | Admitting: Obstetrics and Gynecology

## 2021-12-06 VITALS — BP 120/80 | Ht 64.0 in | Wt 246.0 lb

## 2021-12-06 DIAGNOSIS — Z124 Encounter for screening for malignant neoplasm of cervix: Secondary | ICD-10-CM

## 2021-12-06 DIAGNOSIS — Z01419 Encounter for gynecological examination (general) (routine) without abnormal findings: Secondary | ICD-10-CM

## 2021-12-06 DIAGNOSIS — R87612 Low grade squamous intraepithelial lesion on cytologic smear of cervix (LGSIL): Secondary | ICD-10-CM | POA: Diagnosis not present

## 2021-12-06 DIAGNOSIS — Z1151 Encounter for screening for human papillomavirus (HPV): Secondary | ICD-10-CM | POA: Diagnosis not present

## 2021-12-06 DIAGNOSIS — Z1231 Encounter for screening mammogram for malignant neoplasm of breast: Secondary | ICD-10-CM

## 2021-12-06 NOTE — Patient Instructions (Signed)
I value your feedback and you entrusting us with your care. If you get a [facility] patient survey, I would appreciate you taking the time to let us know about your experience today. Thank you!  [facility]: [phone]  [facility]: [phone]    

## 2021-12-06 NOTE — Progress Notes (Signed)
PCP:  Jerrol Banana., MD   Chief Complaint  Patient presents with   Gynecologic Exam    No concerns     HPI:      Ms. Jodi Carpenter is a 41 y.o. G0P0000 whose LMP was Patient's last menstrual period was 11/12/2021 (exact date)., presents today for her annual examination.  Her menses are regular every 28-30 days, lasting 6-7 days, heavy flow, with 1/4 sized clots. Occas BTB.  Dysmenorrhea mild to mod, not improved with tylenol. Can't have NSAIDs. Pt has always had heavy periods; WNL CBC 3/22. Can't have ablation, IUD; has limited options per Dr. Georgianne Fick. Pt tolerating periods for now.  S/p hysteroscopy, diagnostic laparoscopy, Mirena IUD removal 3/21 with Dr. Georgianne Fick for retained IUD, adnexal mass (hydrosalpinx), AUB. 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   Sex activity: single partner, contraception - none. Pt can't conceive due to scarring per Dr. Georgianne Fick. Last Pap: 12/22/19 Results were: low-grade squamous intraepithelial neoplasia (LGSIL - encompassing HPV,mild dysplasia,CIN I) /neg HPV DNA; repeat pap due today per [facility]. Hx of STDs: none  Last mammogram: never There is no FH of breast cancer. There is no FH of ovarian cancer. The patient does not do self-breast exams.  Tobacco use: The patient denies current or previous tobacco use. Alcohol use: social drinker No drug use.  Exercise: moderately active  She does get adequate calcium but not Vitamin D in her diet.  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[place]) 51/70/0174   Contraception management 10/18/2018   Fistula-in-ano    Anal fissure    Hemorrhoidal skin tags    Ulcerative colitis ([place]) 04/24/2017   Hashimoto's thyroiditis 04/24/2017   Anxiety 06/22/2015    Affective disorder, major 06/22/2015   Adult hypothyroidism 06/22/2015   Menorrhagia with regular cycle 06/22/2015   Adiposity 06/22/2015    Past Surgical History:  Procedure Laterality Date   COLON SURGERY     HYSTEROSCOPY  02/24/2020   Procedure: HYSTEROSCOPY;  Surgeon: Malachy Mood, MD;  Location: [facility];  Service: Gynecology;;   IUD REMOVAL N/A 02/24/2020   Procedure: INTRAUTERINE DEVICE (IUD) REMOVAL Mirena;  Surgeon: Malachy Mood, MD;  Location: [facility];  Service: Gynecology;  Laterality: N/A;   LAPAROSCOPY  02/24/2020   Procedure: LAPAROSCOPY DIAGNOSTIC;  Surgeon: Malachy Mood, MD;  Location: [facility];  Service: Gynecology;;   LEFT COLECTOMY  05/23/2010   sigmoid colectomy with J pouch   MASS EXCISION     pilonidal cyst and abscess   OTHER SURGICAL HISTORY     illeionidal anal stenosis    RECTAL EXAM UNDER ANESTHESIA N/A 10/03/2018   Procedure: RECTAL EXAM UNDER ANESTHESIA;  Surgeon: Olean Ree, MD;  Location: [facility];  Service: General;  Laterality: N/A;   TONSILLECTOMY      Family History  Problem Relation Age of Onset   Hypertension Mother    Panic disorder Mother    Hypertension Father    Diverticulosis Father    Prostate cancer Maternal Grandfather     Social History   Socioeconomic History   Marital status: Married    Spouse name: Not on file   Number of children: 0   Years of education: 16   Highest education level: Not on  file  Occupational History   Occupation: [employer]  Tobacco Use   Smoking status: Former    Packs/day: 0.25    Years: 8.00    Pack years: 2.00    Types: Cigarettes    Quit date: 09/27/2004    Years since quitting: 17.2   Smokeless tobacco: Never  Vaping Use   Vaping Use: Never used  Substance and Sexual Activity   Alcohol use: Yes    Comment: 1-2 a month   Drug use: No   Sexual activity: Yes    Birth control/protection: None  Other Topics Concern   Not on file  Social History Narrative   Not  on file   Social Determinants of Health   Financial Resource Strain: Not on file  Food Insecurity: Not on file  Transportation Needs: Not on file  Physical Activity: Not on file  Stress: Not on file  Social Connections: Not on file  Intimate Partner Violence: Not on file     Current Outpatient Medications:    ALPRAZolam (XANAX) 0.5 MG tablet, TAKE 1 TABLET (0.5 MG TOTAL) BY MOUTH 2 (TWO) TIMES DAILY AS NEEDED FOR ANXIETY., Disp: 45 tablet, Rfl: 0   levothyroxine (SYNTHROID) 125 MCG tablet, TAKE 1 TABLET BY MOUTH DAILY BEFORE BREAKFAST., Disp: 90 tablet, Rfl: 3   mesalamine (CANASA) 1000 MG suppository, Place 1,000 mg rectally at bedtime., Disp: , Rfl:    Multiple Vitamins-Minerals (MULTIVITAMIN ADULT PO), Take 1 tablet by mouth daily., Disp: , Rfl:      ROS:  Review of Systems  Constitutional:  Negative for fatigue, fever and unexpected weight change.  Respiratory:  Negative for cough, shortness of breath and wheezing.   Cardiovascular:  Negative for chest pain, palpitations and leg swelling.  Gastrointestinal:  Negative for blood in stool, constipation, diarrhea, nausea and vomiting.  Endocrine: Negative for cold intolerance, heat intolerance and polyuria.  Genitourinary:  Negative for dyspareunia, dysuria, flank pain, frequency, genital sores, hematuria, menstrual problem, pelvic pain, urgency, vaginal bleeding, vaginal discharge and vaginal pain.  Musculoskeletal:  Negative for back pain, joint swelling and myalgias.  Skin:  Negative for rash.  Neurological:  Negative for dizziness, syncope, light-headedness, numbness and headaches.  Hematological:  Negative for adenopathy.  Psychiatric/Behavioral:  Negative for agitation, confusion, sleep disturbance and suicidal ideas. The patient is not nervous/anxious.   BREAST: No symptoms   Objective: BP 120/80    Ht 5\' 4"  (1.626 m)    Wt 246 lb (111.6 kg)    LMP 11/12/2021 (Exact Date)    BMI 42.23 kg/m    Physical  Exam Constitutional:      Appearance: She is well-developed.  Genitourinary:     Vulva normal.     Right Labia: No rash, tenderness or lesions.    Left Labia: No tenderness, lesions or rash.    No vaginal discharge, erythema or tenderness.      Right Adnexa: not tender and no mass present.    Left Adnexa: not tender and no mass present.    No cervical friability or polyp.     Uterus is not enlarged or tender.  Breasts:    Right: No mass, nipple discharge, skin change or tenderness.     Left: No mass, nipple discharge, skin change or tenderness.  Neck:     Thyroid: No thyromegaly.  Cardiovascular:     Rate and Rhythm: Normal rate and regular rhythm.     Heart sounds: Normal heart sounds. No murmur heard. Pulmonary:  Effort: Pulmonary effort is normal.     Breath sounds: Normal breath sounds.  Abdominal:     Palpations: Abdomen is soft.     Tenderness: There is no abdominal tenderness. There is no guarding or rebound.  Musculoskeletal:        General: Normal range of motion.     Cervical back: Normal range of motion.  Lymphadenopathy:     Cervical: No cervical adenopathy.  Neurological:     General: No focal deficit present.     Mental Status: She is alert and oriented to person, place, and time.     Cranial Nerves: No cranial nerve deficit.  Skin:    General: Skin is warm and dry.  Psychiatric:        Mood and Affect: Mood normal.        Behavior: Behavior normal.        Thought Content: Thought content normal.        Judgment: Judgment normal.  Vitals reviewed.    Assessment/Plan: Encounter for annual routine gynecological examination  Cervical cancer screening - Plan: Cytology - PAP  Screening for HPV (human papillomavirus) - Plan: Cytology - PAP  LGSIL on Pap smear of cervix - Plan: Cytology - PAP; repeat pap today. Will call with results and dispo  Encounter for screening mammogram for malignant neoplasm of breast - Plan: MM 3D SCREEN BREAST BILATERAL; pt  [facility]            GYN counsel breast self exam, mammography screening, adequate intake of calcium and vitamin D, diet and exercise     F/U  Return in about 1 year (around 12/06/2022).  Giordana Weinheimer B. Merrell [place], PA-C 12/06/2021 1:45 PM

## 2021-12-09 LAB — CYTOLOGY - PAP
Comment: NEGATIVE
Diagnosis: NEGATIVE
High risk HPV: NEGATIVE

## 2021-12-20 ENCOUNTER — Other Ambulatory Visit: Payer: Self-pay

## 2021-12-20 ENCOUNTER — Ambulatory Visit: Payer: Managed Care, Other (non HMO) | Admitting: Dermatology

## 2021-12-20 DIAGNOSIS — D1800 Hemangioma unspecified site: Secondary | ICD-10-CM

## 2021-12-20 DIAGNOSIS — D039 Melanoma in situ, unspecified: Secondary | ICD-10-CM

## 2021-12-20 DIAGNOSIS — B078 Other viral warts: Secondary | ICD-10-CM | POA: Diagnosis not present

## 2021-12-20 DIAGNOSIS — D229 Melanocytic nevi, unspecified: Secondary | ICD-10-CM

## 2021-12-20 DIAGNOSIS — D0359 Melanoma in situ of other part of trunk: Secondary | ICD-10-CM | POA: Diagnosis not present

## 2021-12-20 DIAGNOSIS — Z1283 Encounter for screening for malignant neoplasm of skin: Secondary | ICD-10-CM

## 2021-12-20 DIAGNOSIS — L814 Other melanin hyperpigmentation: Secondary | ICD-10-CM

## 2021-12-20 DIAGNOSIS — L821 Other seborrheic keratosis: Secondary | ICD-10-CM

## 2021-12-20 DIAGNOSIS — D485 Neoplasm of uncertain behavior of skin: Secondary | ICD-10-CM

## 2021-12-20 DIAGNOSIS — L578 Other skin changes due to chronic exposure to nonionizing radiation: Secondary | ICD-10-CM

## 2021-12-20 HISTORY — DX: Melanoma in situ, unspecified: D03.9

## 2021-12-20 MED ORDER — MUPIROCIN 2 % EX OINT: 1.0000 "application " | TOPICAL_OINTMENT | Freq: Every day | CUTANEOUS | 0 refills | Status: DC

## 2021-12-20 NOTE — Progress Notes (Signed)
Follow-Up Visit   Subjective  Jodi Carpenter is a 41 y.o. female who presents for the following: FBSE (Patient here for full body skin exam and skin cancer screening. Patient with no hx of skin cancer. Patient does have some raised spots at scalp, present for about 1 1/2 months. ).  The following portions of the chart were reviewed this encounter and updated as appropriate:   Tobacco   Allergies   Meds   Problems   Med Hx   Surg Hx   Fam Hx       Review of Systems:  No other skin or systemic complaints except as noted in HPI or Assessment and Plan.  Objective  Well appearing patient in no apparent distress; mood and affect are within normal limits.  A full examination was performed including scalp, head, eyes, ears, nose, lips, neck, chest, axillae, abdomen, back, buttocks, bilateral upper extremities, bilateral lower extremities, hands, feet, fingers, toes, fingernails, and toenails. All findings within normal limits unless otherwise noted below.  Scalp (2) Verrucous papules -- Discussed viral etiology and contagion.   left buttock 0.5 cm medium to dark brown macule       Assessment & Plan  Other viral warts (2) Scalp  Discussed viral etiology and risk of spread.  Discussed multiple treatments may be required to clear warts.  Discussed possible post-treatment dyspigmentation and risk of recurrence.  Prior to procedure, discussed risks of blister formation, small wound, skin dyspigmentation, or rare scar following cryotherapy. Recommend Vaseline ointment to treated areas while healing.  Cantharidin Plus is a blistering agent that comes from a beetle.  It needs to be washed off in about 4 hours after application.  Although it is painless when applied in office, it may cause symptoms of mild pain and burning several hours later.  Treated areas will swell and turn red, and blisters may form.  Vaseline and a bandaid may be applied until wound has healed.  Once healed, the  skin may remain temporarily discolored.  It can take weeks to months for pigmentation to return to normal.  Advised to wash off with soap and water in 4 hours or sooner if it becomes tender before then.    Destruction of lesion - Scalp Complexity: simple   Destruction method: cryotherapy   Informed consent: discussed and consent obtained   Lesion destroyed using liquid nitrogen: Yes   Region frozen until ice ball extended beyond lesion: Yes   Outcome: patient tolerated procedure well with no complications   Post-procedure details: wound care instructions given    Destruction of lesion - Scalp  Destruction method: chemical removal   Informed consent: discussed and consent obtained   Timeout:  patient name, date of birth, surgical site, and procedure verified Chemical destruction method comment:  Cantharidin plus Application time:  4 hours Procedure instructions: patient instructed to wash and dry area   Outcome: patient tolerated procedure well with no complications   Post-procedure details: wound care instructions given    Neoplasm of uncertain behavior of skin left buttock  Epidermal / dermal shaving  Lesion diameter (cm):  0.5 Informed consent: discussed and consent obtained   Timeout: patient name, date of birth, surgical site, and procedure verified   Patient was prepped and draped in usual sterile fashion: area prepped with isopropyl alcohol. Anesthesia: the lesion was anesthetized in a standard fashion   Local anesthetic: 0.25 % bupivicaine. Instrument used: DermaBlade   Hemostasis achieved with: aluminum chloride   Outcome: patient  tolerated procedure well   Post-procedure details: wound care instructions given   Additional details:  Mupirocin and a bandage applied  mupirocin ointment (BACTROBAN) 2 % Apply 1 application topically daily. With dressing changes  Specimen 1 - Surgical pathology Differential Diagnosis: r/o Atypia  Check Margins: No 0.5 cm medium to  dark brown macule   Lentigines - Scattered tan macules - Due to sun exposure - Benign-appearing, observe - Recommend daily broad spectrum sunscreen SPF 30+ to sun-exposed areas, reapply every 2 hours as needed. - Call for any changes  Seborrheic Keratoses - Stuck-on, waxy, tan-brown papules and/or plaques  - Benign-appearing - Discussed benign etiology and prognosis. - Observe - Call for any changes  Melanocytic Nevi - Tan-brown and/or pink-flesh-colored symmetric macules and papules - Benign appearing on exam today - Observation - Call clinic for new or changing moles - Recommend daily use of broad spectrum spf 30+ sunscreen to sun-exposed areas.   Hemangiomas - Red papules - Discussed benign nature - Observe - Call for any changes  Actinic Damage - Chronic condition, secondary to cumulative UV/sun exposure - diffuse scaly erythematous macules with underlying dyspigmentation - Recommend daily broad spectrum sunscreen SPF 30+ to sun-exposed areas, reapply every 2 hours as needed.  - Staying in the shade or wearing long sleeves, sun glasses (UVA+UVB protection) and wide brim hats (4-inch brim around the entire circumference of the hat) are also recommended for sun protection.  - Call for new or changing lesions.  Skin cancer screening performed today.  Return for 4-8 weeks warts.  Graciella Belton, RMA, am acting as scribe for Forest Gleason, MD .  Documentation: I have reviewed the above documentation for accuracy and completeness, and I agree with the above.  Forest Gleason, MD

## 2021-12-20 NOTE — Patient Instructions (Addendum)
Cryotherapy Aftercare  Wash gently with soap and water everyday.   Apply Vaseline and Band-Aid daily until healed.   Prior to procedure, discussed risks of blister formation, small wound, skin dyspigmentation, or rare scar following cryotherapy. Recommend Vaseline ointment to treated areas while healing.  Instructions for After In-Office Application of Cantharidin  1. This is a strong medicine; please follow ALL instructions.  2. Gently wash off with soap and water in four hours or sooner s directed by your physician.  3. **WARNING** this medicine can cause severe blistering, blood blisters, infection, and/or scarring if it is not washed off as directed.  4. Your progress will be rechecked in 1-2 months; call sooner if there are any questions or problems.  Cantharidin Plus is a blistering agent that comes from a beetle.  It needs to be washed off in about 4 hours after application.  Although it is painless when applied in office, it may cause symptoms of mild pain and burning several hours later.  Treated areas will swell and turn red, and blisters may form.  Vaseline and a bandaid may be applied until wound has healed.  Once healed, the skin may remain temporarily discolored.  It can take weeks to months for pigmentation to return to normal.  Advised to wash off with soap and water in 4 hours or sooner if it becomes tender before then.   Recommend taking Heliocare sun protection supplement daily in sunny weather for additional sun protection. For maximum protection on the sunniest days, you can take up to 2 capsules of regular Heliocare OR take 1 capsule of Heliocare Ultra. For prolonged exposure (such as a full day in the sun), you can repeat your dose of the supplement 4 hours after your first dose. Heliocare can be purchased at [employer], at some [facility] or at [email or web address].    Melanoma ABCDEs  Melanoma is the most dangerous type of skin cancer, and is the leading cause  of death from skin disease.  You are more likely to develop melanoma if you: Have light-colored skin, light-colored eyes, or red or blond hair Spend a lot of time in the sun Tan regularly, either outdoors or in a tanning bed Have had blistering sunburns, especially during childhood Have a close family member who has had a melanoma Have atypical moles or large birthmarks  Early detection of melanoma is key since treatment is typically straightforward and cure rates are extremely high if we catch it early.   The first sign of melanoma is often a change in a mole or a new dark spot.  The ABCDE system is a way of remembering the signs of melanoma.  A for asymmetry:  The two halves do not match. B for border:  The edges of the growth are irregular. C for color:  A mixture of colors are present instead of an even brown color. D for diameter:  Melanomas are usually (but not always) greater than 64mm - the size of a pencil eraser. E for evolution:  The spot keeps changing in size, shape, and color.  Please check your skin once per month between visits. You can use a small mirror in front and a large mirror behind you to keep an eye on the back side or your body.   If you see any new or changing lesions before your next follow-up, please call to schedule a visit.  Please continue daily skin protection including broad spectrum sunscreen SPF 30+ to sun-exposed areas, reapplying every  2 hours as needed when you're outdoors.    If You Need Anything After Your Visit  If you have any questions or concerns for your doctor, please call our main line at [phone] and press option 4 to reach your doctor's medical assistant. If no one answers, please leave a voicemail as directed and we will return your call as soon as possible. Messages left after 4 pm will be answered the following business day.   You may also send [place] a message via [place]. We typically respond to MyChart messages within 1-2 business  days.  For prescription refills, please ask your pharmacy to contact our office. Our fax number is [phone].  If you have an urgent issue when the clinic is closed that cannot wait until the next business day, you can page your doctor at the number below.    Please note that while we do our best to be available for urgent issues outside of office hours, we are not available 24/7.   If you have an urgent issue and are unable to reach [place], you may choose to seek medical care at your doctor's office, retail clinic, urgent care center, or emergency room.  If you have a medical emergency, please immediately call 911 or go to the emergency department.  Pager Numbers  - Dr. Nehemiah Massed: [phone]  - Dr. Laurence Ferrari: [phone]  - Dr. Nicole Kindred: [phone]  In the event of inclement weather, please call our main line at [phone] for an update on the status of any delays or closures.  Dermatology Medication Tips: Please keep the boxes that topical medications come in in order to help keep track of the instructions about where and how to use these. Pharmacies typically print the medication instructions only on the boxes and not directly on the medication tubes.   If your medication is too expensive, please contact our office at [phone] option 4 or send [place] a message through [place].   We are unable to tell what your co-pay for medications will be in advance as this is different depending on your insurance coverage. However, we may be able to find a substitute medication at lower cost or fill out paperwork to get insurance to cover a needed medication.   If a prior authorization is required to get your medication covered by your insurance company, please allow [place] 1-2 business days to complete this process.  Drug prices often vary depending on where the prescription is filled and some pharmacies may offer cheaper prices.  The website www.goodrx.com contains coupons for medications through  different pharmacies. The prices here do not account for what the cost may be with help from insurance (it may be cheaper with your insurance), but the website can give you the price if you did not use any insurance.  - You can print the associated coupon and take it with your prescription to the pharmacy.  - You may also stop by our office during regular business hours and pick up a GoodRx coupon card.  - If you need your prescription sent electronically to a different pharmacy, notify our office through [facility] or by phone at [phone] option 4.     Si Usted Necesita Algo Despus de Su Visita  Tambin puede enviarnos un mensaje a travs de Pharmacist, community. Por lo general respondemos a los mensajes de MyChart en el transcurso de 1 a 2 das hbiles.  Para renovar recetas, por favor pida a su farmacia que se ponga en contacto con Cleotis Nipper  oficina. Harland Dingwall de fax es [place] [phone].  Si tiene un asunto urgente cuando la clnica est cerrada y que no puede esperar hasta el siguiente da hbil, puede llamar/localizar a su doctor(a) al nmero que aparece a continuacin.   Por favor, tenga en cuenta que aunque hacemos todo lo posible para estar disponibles para asuntos urgentes fuera del horario de [place], no estamos disponibles las 24 horas del da, los 7 das de la [place].   Si tiene un problema urgente y no puede comunicarse con nosotros, puede optar por buscar atencin mdica  en el consultorio de su doctor(a), en una clnica privada, en un centro de atencin urgente o en una sala de emergencias.  Si tiene Engineering geologist, por favor llame inmediatamente al 911 o vaya a la sala de emergencias.  Nmeros de bper  - Dr. Nehemiah Massed: [phone]  - Dra. Moye: [phone]  - Dra. Nicole Kindred: [phone]  En caso de inclemencias del [place], por favor llame a Johnsie Kindred principal al [phone] para una actualizacin sobre el [place] de cualquier retraso o cierre.  Consejos  para la medicacin en dermatologa: Por favor, guarde las cajas en las que vienen los medicamentos de uso tpico para ayudarle a seguir las instrucciones sobre dnde y cmo usarlos. Las farmacias generalmente imprimen las instrucciones del medicamento slo en las cajas y no directamente en los tubos del [place].   Si su medicamento es muy caro, por favor, pngase en contacto con Zigmund Daniel llamando al [phone] y presione la opcin 4 o envenos un mensaje a travs de Pharmacist, community.   No podemos decirle cul ser su copago por los medicamentos por adelantado ya que esto es diferente dependiendo de la cobertura de su seguro. Sin embargo, es posible que podamos encontrar un medicamento sustituto a Electrical engineer un formulario para que el seguro cubra el medicamento que se considera necesario.   Si se requiere una autorizacin previa para que su compaa de seguros [place] su medicamento, por favor permtanos de 1 a 2 das hbiles para completar este proceso.  Los precios de los medicamentos varan con frecuencia dependiendo del Environmental consultant de dnde se surte la receta y alguna farmacias pueden ofrecer precios ms baratos.  El sitio web www.goodrx.com tiene cupones para medicamentos de Airline pilot. Los precios aqu no tienen en cuenta lo que podra costar con la ayuda del seguro (puede ser ms barato con su seguro), pero el sitio web puede darle el precio si no utiliz Research scientist (physical sciences).  - Puede imprimir el cupn correspondiente y llevarlo con su receta a la farmacia.  - Tambin puede pasar por nuestra oficina durante el horario de atencin regular y Charity fundraiser una tarjeta de cupones de GoodRx.  - Si necesita que su receta se enve electrnicamente a una farmacia diferente, informe a nuestra oficina a travs de MyChart de [place] o por telfono llamando al [phone] y presione la opcin 4.  Wound Care Instructions  Cleanse wound gently with soap and water once a day then pat dry with clean  gauze. Apply a thing coat of Petrolatum (petroleum jelly, "Vaseline") over the wound (unless you have an allergy to this). We recommend that you use a new, sterile tube of Vaseline. Do not pick or remove scabs. Do not remove the yellow or white "healing tissue" from the base of the wound.  Cover the wound with fresh, clean, nonstick gauze and secure with paper tape. You may use Band-Aids in place of gauze and tape if the  would is small enough, but would recommend trimming much of the tape off as there is often too much. Sometimes Band-Aids can irritate the skin.  You should call the office for your biopsy report after 1 week if you have not already been contacted.  If you experience any problems, such as abnormal amounts of bleeding, swelling, significant bruising, significant pain, or evidence of infection, please call the office immediately.  FOR ADULT SURGERY PATIENTS: If you need something for pain relief you may take 1 extra strength Tylenol (acetaminophen) AND 2 Ibuprofen (200mg  each) together every 4 hours as needed for pain. (do not take these if you are allergic to them or if you have a reason you should not take them.) Typically, you may only need pain medication for 1 to 3 days.

## 2021-12-22 ENCOUNTER — Telehealth: Payer: Self-pay

## 2021-12-22 NOTE — Telephone Encounter (Signed)
Left msg for patient advising she is scheduled for surgery 2/1 and we need to schedule her for a TBSE in 3 months with one of the other drs.I told her she could call next week or we could just schedule her when she is here for surgery.Cherly Hensen

## 2021-12-22 NOTE — Telephone Encounter (Signed)
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 Will schedule for excision next Wednesday at 9 am and for FBSE in 3 months.  [name], can you please schedule for both of these? Thank you!

## 2021-12-23 ENCOUNTER — Encounter: Payer: Self-pay | Admitting: Dermatology

## 2021-12-27 ENCOUNTER — Encounter: Payer: Self-pay | Admitting: Dermatology

## 2021-12-28 ENCOUNTER — Other Ambulatory Visit: Payer: Self-pay

## 2021-12-28 ENCOUNTER — Ambulatory Visit: Payer: Managed Care, Other (non HMO) | Admitting: Dermatology

## 2021-12-28 DIAGNOSIS — D0359 Melanoma in situ of other part of trunk: Secondary | ICD-10-CM | POA: Diagnosis not present

## 2021-12-28 DIAGNOSIS — D038 Melanoma in situ of other sites: Secondary | ICD-10-CM

## 2021-12-28 MED ORDER — DOXYCYCLINE MONOHYDRATE 100 MG PO CAPS: 100.0000 mg | ORAL_CAPSULE | Freq: Two times a day (BID) | ORAL | 0 refills | Status: AC

## 2021-12-28 NOTE — Patient Instructions (Addendum)
Start doxycycline monohydrate 100 mg twice daily with food x 5 days.  Start mupirocin daily with dressing changes.  Doxycycline should be taken with food to prevent nausea. Do not lay down for 30 minutes after taking. Be cautious with sun exposure and use good sun protection while on this medication. Pregnant women should not take this medication.   Wound Care Instructions  Cleanse wound gently with soap and water once a day then pat dry with clean gauze. Apply a thing coat of Petrolatum (petroleum jelly, "Vaseline") over the wound (unless you have an allergy to this). We recommend that you use a new, sterile tube of Vaseline. Do not pick or remove scabs. Do not remove the yellow or white "healing tissue" from the base of the wound.  Cover the wound with fresh, clean, nonstick gauze and secure with paper tape. You may use Band-Aids in place of gauze and tape if the would is small enough, but would recommend trimming much of the tape off as there is often too much. Sometimes Band-Aids can irritate the skin.  You should call the office for your biopsy report after 1 week if you have not already been contacted.  If you experience any problems, such as abnormal amounts of bleeding, swelling, significant bruising, significant pain, or evidence of infection, please call the office immediately.  FOR ADULT SURGERY PATIENTS: If you need something for pain relief you may take 1 extra strength Tylenol (acetaminophen) AND 2 Ibuprofen (200mg  each) together every 4 hours as needed for pain. (do not take these if you are allergic to them or if you have a reason you should not take them.) Typically, you may only need pain medication for 1 to 3 days.     If You Need Anything After Your Visit  If you have any questions or concerns for your doctor, please call our main line at [phone] and press option 4 to reach your doctor's medical assistant. If no one answers, please leave a voicemail as directed and we  will return your call as soon as possible. Messages left after 4 pm will be answered the following business day.   You may also send [place] a message via [place]. We typically respond to MyChart messages within 1-2 business days.  For prescription refills, please ask your pharmacy to contact our office. Our fax number is [phone].  If you have an urgent issue when the clinic is closed that cannot wait until the next business day, you can page your doctor at the number below.    Please note that while we do our best to be available for urgent issues outside of office hours, we are not available 24/7.   If you have an urgent issue and are unable to reach [place], you may choose to seek medical care at your doctor's office, retail clinic, urgent care center, or emergency room.  If you have a medical emergency, please immediately call 911 or go to the emergency department.  Pager Numbers  - Dr. Nehemiah Massed: [phone]  - Dr. Laurence Ferrari: [phone]  - Dr. Nicole Kindred: [phone]  In the event of inclement weather, please call our main line at [phone] for an update on the status of any delays or closures.  Dermatology Medication Tips: Please keep the boxes that topical medications come in in order to help keep track of the instructions about where and how to use these. Pharmacies typically print the medication instructions only on the boxes and not directly on the medication tubes.  If your medication is too expensive, please contact our office at [phone] option 4 or send [place] a message through [place].   We are unable to tell what your co-pay for medications will be in advance as this is different depending on your insurance coverage. However, we may be able to find a substitute medication at lower cost or fill out paperwork to get insurance to cover a needed medication.   If a prior authorization is required to get your medication covered by your insurance company, please allow [place] 1-2  business days to complete this process.  Drug prices often vary depending on where the prescription is filled and some pharmacies may offer cheaper prices.  The website www.goodrx.com contains coupons for medications through different pharmacies. The prices here do not account for what the cost may be with help from insurance (it may be cheaper with your insurance), but the website can give you the price if you did not use any insurance.  - You can print the associated coupon and take it with your prescription to the pharmacy.  - You may also stop by our office during regular business hours and pick up a GoodRx coupon card.  - If you need your prescription sent electronically to a different pharmacy, notify our office through [facility] or by phone at [phone] option 4.     Si Usted Necesita Algo Despus de Su Visita  Tambin puede enviarnos un mensaje a travs de Pharmacist, community. Por lo general respondemos a los mensajes de MyChart en el transcurso de 1 a 2 das hbiles.  Para renovar recetas, por favor pida a su farmacia que se ponga en contacto con nuestra oficina. Harland Dingwall de fax es [place] [phone].  Si tiene un asunto urgente cuando la clnica est cerrada y que no puede esperar hasta el siguiente da hbil, puede llamar/localizar a su doctor(a) al nmero que aparece a continuacin.   Por favor, tenga en cuenta que aunque hacemos todo lo posible para estar disponibles para asuntos urgentes fuera del horario de [place], no estamos disponibles las 24 horas del da, los 7 das de la [place].   Si tiene un problema urgente y no puede comunicarse con nosotros, puede optar por buscar atencin mdica  en el consultorio de su doctor(a), en una clnica privada, en un centro de atencin urgente o en una sala de emergencias.  Si tiene Engineering geologist, por favor llame inmediatamente al 911 o vaya a la sala de emergencias.  Nmeros de bper  - Dr. Nehemiah Massed: [phone]  - Dra.  Moye: [phone]  - Dra. Nicole Kindred: [phone]  En caso de inclemencias del [place], por favor llame a Johnsie Kindred principal al [phone] para una actualizacin sobre el [place] de cualquier retraso o cierre.  Consejos para la medicacin en dermatologa: Por favor, guarde las cajas en las que vienen los medicamentos de uso tpico para ayudarle a seguir las instrucciones sobre dnde y cmo usarlos. Las farmacias generalmente imprimen las instrucciones del medicamento slo en las cajas y no directamente en los tubos del [place].   Si su medicamento es muy caro, por favor, pngase en contacto con Zigmund Daniel llamando al [phone] y presione la opcin 4 o envenos un mensaje a travs de Pharmacist, community.   No podemos decirle cul ser su copago por los medicamentos por adelantado ya que esto es diferente dependiendo de la cobertura de su seguro. Sin embargo, es posible que podamos encontrar un medicamento sustituto a Geneticist, molecular  formulario para que el seguro cubra el medicamento que se considera necesario.   Si se requiere una autorizacin previa para que su compaa de seguros [place] su medicamento, por favor permtanos de 1 a 2 das hbiles para completar este proceso.  Los precios de los medicamentos varan con frecuencia dependiendo del Environmental consultant de dnde se surte la receta y alguna farmacias pueden ofrecer precios ms baratos.  El sitio web www.goodrx.com tiene cupones para medicamentos de Airline pilot. Los precios aqu no tienen en cuenta lo que podra costar con la ayuda del seguro (puede ser ms barato con su seguro), pero el sitio web puede darle el precio si no utiliz Research scientist (physical sciences).  - Puede imprimir el cupn correspondiente y llevarlo con su receta a la farmacia.  - Tambin puede pasar por nuestra oficina durante el horario de atencin regular y Charity fundraiser una tarjeta de cupones de GoodRx.  - Si necesita que su receta se enve electrnicamente a una farmacia diferente,  informe a nuestra oficina a travs de MyChart de [place] o por telfono llamando al [phone] y presione la opcin 4.

## 2021-12-28 NOTE — Progress Notes (Signed)
Follow-Up Visit   Subjective  Jodi Carpenter is a 41 y.o. female who presents for the following: Procedure (Patient here today for excision of bx proven Mmis at left buttock.).  Patient accompanied by husband.   The following portions of the chart were reviewed this encounter and updated as appropriate:   Tobacco   Allergies   Meds   Problems   Med Hx   Surg Hx   Fam Hx       Review of Systems:  No other skin or systemic complaints except as noted in HPI or Assessment and Plan.  Objective  Well appearing patient in no apparent distress; mood and affect are within normal limits.  A focused examination was performed including buttocks. Relevant physical exam findings are noted in the Assessment and Plan.  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  Skin excision  Lesion length (cm):  0.8 Margin per side (cm):  1 Total excision diameter (cm):  2.8 Informed consent: discussed and consent obtained   Timeout: patient name, date of birth, surgical site, and procedure verified   Procedure prep:  Patient was prepped and draped in usual sterile fashion Prep type:  Chlorhexidine Anesthesia: the lesion was anesthetized in a standard fashion   Anesthetic:  1% lidocaine w/ epinephrine 1-100,000 buffered w/ 8.4% NaHCO3 (6 cc lido w/epi, 9 cc 0.25% bupivicaine) Instrument used: #15 blade   Hemostasis achieved with: suture, pressure and electrodesiccation   Outcome: patient tolerated procedure well with no complications   Post-procedure details: wound care instructions given   Additional details:  Mupirocin and a pressure dressing applied  Skin repair Complexity:  Complex Final length (cm):  7.2 Informed consent: discussed and consent obtained   Timeout: patient name, date of birth, surgical site, and procedure verified   Procedure prep:  Patient was prepped and draped in usual sterile fashion Prep  type:  Chlorhexidine Anesthesia: the lesion was anesthetized in a standard fashion   Anesthetic:  1% lidocaine w/ epinephrine 1-100,000 local infiltration Reason for type of repair: reduce tension to allow closure, reduce the risk of dehiscence, infection, and necrosis, reduce subcutaneous dead space and avoid a hematoma, allow closure of the large defect, allow side-to-side closure without requiring a flap or graft and enhance both functionality and cosmetic results   Undermining: area extensively undermined   Subcutaneous layers (deep stitches):  Suture size:  3-0 Suture type: Vicryl (polyglactin 910)   Stitches:  Buried vertical mattress Fine/surface layer approximation (top stitches):  Suture size:  4-0 Suture type: Prolene (polypropylene)   Suture removal (days):  14 Hemostasis achieved with: pressure and electrodesiccation Outcome: patient tolerated procedure well with no complications   Post-procedure details: wound care instructions given   Additional details:  Extensive undermining greater than the maximum width of the defect along at least one entire edge of the defect was performed Maximum width of defect perpendicular to the line of the closure 2.6 cm Width of undermining done 3.0 cm  Mupirocin and a pressure bandage applied   doxycycline (MONODOX) 100 MG capsule Take 1 capsule (100 mg total) by mouth 2 (two) times daily for 5 days. Take with food  Specimen 1 - Surgical pathology Differential Diagnosis: BX proven MELANOMA IN SITU ARISING IN A DYSPLASTIC NEVUS  Check Margins: yes Healing biopsy site Tagged at 3 o'clock [id number]  Start doxycycline monohydrate 100 mg twice daily with food x 5  days.  Start mupirocin daily with dressing changes.  Doxycycline should be taken with food to prevent nausea. Do not lay down for 30 minutes after taking. Be cautious with sun exposure and use good sun protection while on this medication. Pregnant women should not take this  medication.     Return in about 2 weeks (around 01/11/2022) for Suture Removal.  Graciella Belton, RMA, am acting as scribe for Forest Gleason, MD .  Documentation: I have reviewed the above documentation for accuracy and completeness, and I agree with the above.  Forest Gleason, MD

## 2021-12-29 ENCOUNTER — Telehealth: Payer: Self-pay

## 2021-12-29 NOTE — Telephone Encounter (Signed)
Called patient to check on her following yesterday's surgery. Patient reports some soreness but is controlled with ice and tylenol. Patient advised to call office or page Dr. Laurence Ferrari if she needs anything./js

## 2022-01-03 ENCOUNTER — Telehealth: Payer: Self-pay

## 2022-01-03 NOTE — Telephone Encounter (Addendum)
Called patient and discussed results with patient and margins are free. Patient verbalized understanding and denied further question. Patient reports everything is healing well and no concerns at this time.   ----- Message from Alfonso Patten, MD sent at 01/03/2022  1:18 PM EST ----- Skin (M), left buttock NO RESIDUAL MELANOMA IN SITU, MARGINS FREE -->  Entire lesion appears to be out. No additional treatment needed at this time.   MAs please call. Thank you!

## 2022-01-04 ENCOUNTER — Encounter: Payer: Self-pay | Admitting: Dermatology

## 2022-01-11 ENCOUNTER — Ambulatory Visit: Payer: Managed Care, Other (non HMO) | Admitting: Dermatology

## 2022-01-11 ENCOUNTER — Other Ambulatory Visit: Payer: Self-pay

## 2022-01-11 DIAGNOSIS — Z4802 Encounter for removal of sutures: Secondary | ICD-10-CM

## 2022-01-11 NOTE — Progress Notes (Signed)
° °  Follow-Up Visit   Subjective  Jodi Carpenter is a 41 y.o. female who presents for the following: Follow-up (Patient suture removal at left buttocks. Patient reports that warts have cleared. ).  The following portions of the chart were reviewed this encounter and updated as appropriate:  Tobacco   Allergies   Meds   Problems   Med Hx   Surg Hx   Fam Hx       Review of Systems: No other skin or systemic complaints except as noted in HPI or Assessment and Plan.   Objective  Well appearing patient in no apparent distress; mood and affect are within normal limits.  A focused examination was performed including left buttock. Relevant physical exam findings are noted in the Assessment and Plan.   Assessment & Plan   Encounter for Removal of Sutures - Incision site at the left buttock  is clean, dry and intact - Wound cleansed, sutures removed, wound cleansed and steri strips applied.  - Discussed pathology results showing clear  - Patient advised to keep steri-strips dry until they fall off. - Scars remodel for a full year. - Once steri-strips fall off, patient can apply over-the-counter silicone scar cream each night to help with scar remodeling if desired. - Patient advised to call with any concerns or if they notice any new or changing lesions.  Return for FBSE in August (keep May).  I, Ruthell Rummage, CMA, scribed for Alfonso Patten, MD.  Documentation: I have reviewed the above documentation for accuracy and completeness, and I agree with the above.  Forest Gleason, MD

## 2022-01-19 ENCOUNTER — Other Ambulatory Visit: Payer: Self-pay

## 2022-01-19 ENCOUNTER — Encounter: Payer: Self-pay | Admitting: Dermatology

## 2022-01-19 ENCOUNTER — Ambulatory Visit
Admission: RE | Admit: 2022-01-19 | Discharge: 2022-01-19 | Disposition: A | Discharge status: Home / Self Care | Payer: Managed Care, Other (non HMO) | Source: Ambulatory Visit | Attending: Obstetrics and Gynecology | Admitting: Obstetrics and Gynecology

## 2022-01-19 DIAGNOSIS — Z1231 Encounter for screening mammogram for malignant neoplasm of breast: Secondary | ICD-10-CM | POA: Insufficient documentation

## 2022-01-24 ENCOUNTER — Other Ambulatory Visit: Payer: Self-pay | Admitting: Obstetrics and Gynecology

## 2022-01-24 ENCOUNTER — Ambulatory Visit: Payer: Managed Care, Other (non HMO) | Admitting: Dermatology

## 2022-01-24 DIAGNOSIS — R928 Other abnormal and inconclusive findings on diagnostic imaging of breast: Secondary | ICD-10-CM

## 2022-01-24 DIAGNOSIS — N6489 Other specified disorders of breast: Secondary | ICD-10-CM

## 2022-02-09 ENCOUNTER — Ambulatory Visit (INDEPENDENT_AMBULATORY_CARE_PROVIDER_SITE_OTHER): Payer: Managed Care, Other (non HMO) | Admitting: Family Medicine

## 2022-02-09 ENCOUNTER — Other Ambulatory Visit: Payer: Self-pay

## 2022-02-09 ENCOUNTER — Encounter: Payer: Self-pay | Admitting: Family Medicine

## 2022-02-09 VITALS — BP 120/75 | HR 72 | Temp 98.5°F | Resp 14 | Ht 65.0 in | Wt 242.0 lb

## 2022-02-09 DIAGNOSIS — Z6841 Body Mass Index (BMI) 40.0 and over, adult: Secondary | ICD-10-CM | POA: Diagnosis not present

## 2022-02-09 DIAGNOSIS — N951 Menopausal and female climacteric states: Secondary | ICD-10-CM | POA: Diagnosis not present

## 2022-02-09 DIAGNOSIS — Z8742 Personal history of other diseases of the female genital tract: Secondary | ICD-10-CM

## 2022-02-09 DIAGNOSIS — Z0000 Encounter for general adult medical examination without abnormal findings: Secondary | ICD-10-CM

## 2022-02-09 DIAGNOSIS — E039 Hypothyroidism, unspecified: Secondary | ICD-10-CM | POA: Diagnosis not present

## 2022-02-09 DIAGNOSIS — K51919 Ulcerative colitis, unspecified with unspecified complications: Secondary | ICD-10-CM

## 2022-02-09 NOTE — Progress Notes (Signed)
? ? ? ?Complete physical exam ? ?I,April Miller,acting as a scribe for Wilhemena Durie, MD.,have documented all relevant documentation on the behalf of Wilhemena Durie, MD,as directed by  Wilhemena Durie, MD while in the presence of Wilhemena Durie, MD. ? ? ?Patient: Jodi Carpenter   DOB: 04-08-1981   41 y.o. Female  MRN: [id number] ?Visit Date: 02/09/2022 ? ?Today's healthcare provider: Wilhemena Durie, MD  ? ?Chief Complaint  ?Patient presents with  ? Annual Exam  ? ?Subjective  ?  ?Jodi Carpenter is a 41 y.o. female who presents today for a complete physical exam.  ?She reports consuming a general diet. Home exercise routine includes home gym. She generally feels well. She reports sleeping well. She does not have additional problems to discuss today.  ?HPI  ?Patient discusses inability to lose weight despite diet and exercise. ?Patient is having some what she thinks are hormone issues with irregular menses and some hot flashes. ?She is on no birth control but has been told that she cannot have children. ? ?Past Medical History:  ?Diagnosis Date  ? Adenomyosis 03/12/2015  ? Anxiety   ? Colitis   ? Depression   ? Headache   ? Hypothyroidism   ? Infertility, female   ? Melanoma in situ ([place]) 12/20/2021  ? Left Buttock. MIS arising in a dysplastic nevus, close to margin excised 12/28/21  ? Obesity (BMI 35.0-39.9 without comorbidity)   ? Thyroid disease 2012  ? Hashimoto's thyroiditis/ hypothyroidism  ? ?Past Surgical History:  ?Procedure Laterality Date  ? COLON SURGERY    ? HYSTEROSCOPY  02/24/2020  ? Procedure: HYSTEROSCOPY;  Surgeon: Malachy Mood, MD;  Location: [facility] ORS;  Service: Gynecology;;  ? IUD REMOVAL N/A 02/24/2020  ? Procedure: INTRAUTERINE DEVICE (IUD) REMOVAL Mirena;  Surgeon: Malachy Mood, MD;  Location: [facility] ORS;  Service: Gynecology;  Laterality: N/A;  ? LAPAROSCOPY  02/24/2020  ? Procedure: LAPAROSCOPY DIAGNOSTIC;  Surgeon: Malachy Mood, MD;  Location: [facility] ORS;   Service: Gynecology;;  ? LEFT COLECTOMY  05/23/2010  ? sigmoid colectomy with J pouch  ? MASS EXCISION    ? pilonidal cyst and abscess  ? OTHER SURGICAL HISTORY    ? illeionidal anal stenosis   ? RECTAL EXAM UNDER ANESTHESIA N/A 10/03/2018  ? Procedure: RECTAL EXAM UNDER ANESTHESIA;  Surgeon: Olean Ree, MD;  Location: [facility] ORS;  Service: General;  Laterality: N/A;  ? TONSILLECTOMY    ? ?Social History  ? ?Socioeconomic History  ? Marital status: Married  ?  Spouse name: Not on file  ? Number of children: 0  ? Years of education: 42  ? Highest education level: Not on file  ?Occupational History  ? Occupation: Tree surgeon  ?Tobacco Use  ? Smoking status: Former  ?  Packs/day: 0.25  ?  Years: 8.00  ?  Pack years: 2.00  ?  Types: Cigarettes  ?  Quit date: 09/27/2004  ?  Years since quitting: 17.3  ? Smokeless tobacco: Never  ?Vaping Use  ? Vaping Use: Never used  ?Substance and Sexual Activity  ? Alcohol use: Yes  ?  Comment: 1-2 a month  ? Drug use: No  ? Sexual activity: Yes  ?  Birth control/protection: None  ?Other Topics Concern  ? Not on file  ?Social History Narrative  ? Not on file  ? ?Social Determinants of Health  ? ?Financial Resource Strain: Not on file  ?Food Insecurity: Not on file  ?Transportation Needs: Not  on file  ?Physical Activity: Not on file  ?Stress: Not on file  ?Social Connections: Not on file  ?Intimate Partner Violence: Not on file  ? ?Family Status  ?Relation Name Status  ? Mother  Alive  ? Father  Alive  ? Sister  Alive  ? Sister  Alive  ? MGM  Alive  ? MGF  Deceased  ? PGM  Alive  ? PGF  Alive  ? Neg Hx  (Not Specified)  ? ?Family History  ?Problem Relation Age of Onset  ? Hypertension Mother   ? Panic disorder Mother   ? Hypertension Father   ? Diverticulosis Father   ? Prostate cancer Maternal Grandfather   ? Breast cancer Neg Hx   ? ?Allergies  ?Allergen Reactions  ? Cortisporin  [Neomycin-Polymyxin-Hc] Swelling  ?  ?Patient Care Team: ?Jerrol Banana., MD as PCP -  General (Family Medicine)  ? ?Medications: ?Outpatient Medications Prior to Visit  ?Medication Sig  ? ALPRAZolam (XANAX) 0.5 MG tablet TAKE 1 TABLET (0.5 MG TOTAL) BY MOUTH 2 (TWO) TIMES DAILY AS NEEDED FOR ANXIETY.  ? levothyroxine (SYNTHROID) 125 MCG tablet TAKE 1 TABLET BY MOUTH DAILY BEFORE BREAKFAST.  ? mesalamine (CANASA) 1000 MG suppository Place 1,000 mg rectally at bedtime.  ? Multiple Vitamins-Minerals (MULTIVITAMIN ADULT PO) Take 1 tablet by mouth daily.  ? mupirocin ointment (BACTROBAN) 2 % Apply 1 application topically daily. With dressing changes  ? ?No facility-administered medications prior to visit.  ? ? ?Review of Systems  ?Constitutional:  Positive for fatigue.  ?All other systems reviewed and are negative. ? ?  ? Objective  ?  ?BP 120/75 (BP Location: Right Arm, Patient Position: Sitting, Cuff Size: Large)   Pulse 72   Temp 98.5 ?F (36.9 ?C) (Temporal)   Resp 14   Ht '5\' 5"'$  (1.651 m)   Wt 242 lb (109.8 kg)   SpO2 98%   BMI 40.27 kg/m?  ?BP Readings from Last 3 Encounters:  ?02/09/22 120/75  ?12/06/21 120/80  ?05/20/21 114/74  ? ?Wt Readings from Last 3 Encounters:  ?02/09/22 242 lb (109.8 kg)  ?12/06/21 246 lb (111.6 kg)  ?05/20/21 212 lb (96.2 kg)  ? ?  ? ? ?Physical Exam ?Vitals reviewed.  ?Constitutional:   ?   Appearance: She is obese.  ?HENT:  ?   Head: Normocephalic and atraumatic.  ?   Right Ear: External ear normal.  ?   Left Ear: External ear normal.  ?Eyes:  ?   General: No scleral icterus. ?   Conjunctiva/sclera: Conjunctivae normal.  ?Cardiovascular:  ?   Rate and Rhythm: Normal rate and regular rhythm.  ?   Pulses: Normal pulses.  ?   Heart sounds: Normal heart sounds.  ?Pulmonary:  ?   Effort: Pulmonary effort is normal.  ?   Breath sounds: Normal breath sounds.  ?Abdominal:  ?   Palpations: Abdomen is soft.  ?Lymphadenopathy:  ?   Cervical: No cervical adenopathy.  ?Skin: ?   General: Skin is warm and dry.  ?Neurological:  ?   General: No focal deficit present.  ?   Mental  Status: She is alert and oriented to person, place, and time.  ?Psychiatric:     ?   Mood and Affect: Mood normal.     ?   Behavior: Behavior normal.     ?   Thought Content: Thought content normal.     ?   Judgment: Judgment normal.  ?  ? ? ?  Last depression screening scores ?PHQ 2/9 Scores 02/09/2022 02/08/2021 11/26/2019  ?PHQ - 2 Score 0 1 0  ?PHQ- 9 Score 3 5 0  ? ?Last fall risk screening ?Fall Risk  02/09/2022  ?Falls in the past year? 0  ?Number falls in past yr: 0  ?Injury with Fall? 0  ?Risk for fall due to : No Fall Risks  ?Follow up Falls evaluation completed  ? ?Last Audit-C alcohol use screening ?Alcohol Use Disorder Test (AUDIT) 02/09/2022  ?1. How often do you have a drink containing alcohol? 1  ?2. How many drinks containing alcohol do you have on a typical day when you are drinking? 0  ?3. How often do you have six or more drinks on one occasion? 0  ?AUDIT-C Score 1  ?Alcohol Brief Interventions/Follow-up -  ? ?A score of 3 or more in women, and 4 or more in men indicates increased risk for alcohol abuse, EXCEPT if all of the points are from question 1  ? ?No results found for any visits on 02/09/22. ? Assessment & Plan  ?  ?Routine Health Maintenance and Physical Exam ? ?Exercise Activities and Dietary recommendations ? Goals   ?None ?  ? ? ?Immunization History  ?Administered Date(s) Administered  ? Hepatitis B, adult 01/08/1997, 06/16/1997  ? Influenza,inj,Quad PF,6+ Mos 09/09/2018  ? Td 01/08/1997, 05/19/1999  ? Tdap 11/26/2019  ? ? ?Health Maintenance  ?Topic Date Due  ? COVID-19 Vaccine (1) Never done  ? HIV Screening  Never done  ? Hepatitis C Screening  Never done  ? INFLUENZA VACCINE  06/27/2021  ? PAP SMEAR-Modifier  12/06/2024  ? TETANUS/TDAP  11/25/2029  ? HPV VACCINES  Aged Out  ? ? ?Discussed health benefits of physical activity, and encouraged her to engage in regular exercise appropriate for her age and condition. ? ?1. Annual physical exam ? ?- Lipid panel ?- TSH ?- CBC  w/Diff/Platelet ?- Comprehensive Metabolic Panel (CMET) ? ?2. Adult hypothyroidism ? ?- Lipid panel ?- TSH ?- CBC w/Diff/Platelet ?- Comprehensive Metabolic Panel (CMET) ? ?3. Menopausal symptoms ? ?- FSH/LH ? ?4. BMI 40.0-44.9,

## 2022-02-10 ENCOUNTER — Ambulatory Visit
Admission: RE | Admit: 2022-02-10 | Discharge: 2022-02-10 | Disposition: A | Discharge status: Home / Self Care | Payer: Managed Care, Other (non HMO) | Source: Ambulatory Visit | Attending: Obstetrics and Gynecology | Admitting: Obstetrics and Gynecology

## 2022-02-10 ENCOUNTER — Other Ambulatory Visit: Payer: Self-pay | Admitting: Family Medicine

## 2022-02-10 ENCOUNTER — Other Ambulatory Visit: Payer: Self-pay

## 2022-02-10 DIAGNOSIS — R928 Other abnormal and inconclusive findings on diagnostic imaging of breast: Secondary | ICD-10-CM | POA: Diagnosis present

## 2022-02-10 DIAGNOSIS — N6489 Other specified disorders of breast: Secondary | ICD-10-CM | POA: Diagnosis present

## 2022-02-11 LAB — CBC WITH DIFFERENTIAL/PLATELET
Basophils Absolute: 0.1 10*3/uL (ref 0.0–0.2)
Basos: 1 %
EOS (ABSOLUTE): 0.3 10*3/uL (ref 0.0–0.4)
Eos: 4 %
Hematocrit: 40.2 % (ref 34.0–46.6)
Hemoglobin: 13.4 g/dL (ref 11.1–15.9)
Immature Grans (Abs): 0 10*3/uL (ref 0.0–0.1)
Immature Granulocytes: 0 %
Lymphocytes Absolute: 1.9 10*3/uL (ref 0.7–3.1)
Lymphs: 27 %
MCH: 30.2 pg (ref 26.6–33.0)
MCHC: 33.3 g/dL (ref 31.5–35.7)
MCV: 91 fL (ref 79–97)
Monocytes Absolute: 0.7 10*3/uL (ref 0.1–0.9)
Monocytes: 9 %
Neutrophils Absolute: 4.2 10*3/uL (ref 1.4–7.0)
Neutrophils: 59 %
Platelets: 269 10*3/uL (ref 150–450)
RBC: 4.43 x10E6/uL (ref 3.77–5.28)
RDW: 12.2 % (ref 11.7–15.4)
WBC: 7.1 10*3/uL (ref 3.4–10.8)

## 2022-02-11 LAB — COMPREHENSIVE METABOLIC PANEL
ALT: 29 IU/L (ref 0–32)
AST: 18 IU/L (ref 0–40)
Albumin/Globulin Ratio: 1.8 (ref 1.2–2.2)
Albumin: 4.4 g/dL (ref 3.8–4.8)
Alkaline Phosphatase: 62 IU/L (ref 44–121)
BUN/Creatinine Ratio: 18 (ref 9–23)
BUN: 14 mg/dL (ref 6–24)
Bilirubin Total: 0.3 mg/dL (ref 0.0–1.2)
CO2: 22 mmol/L (ref 20–29)
Calcium: 9.1 mg/dL (ref 8.7–10.2)
Chloride: 104 mmol/L (ref 96–106)
Creatinine, Ser: 0.79 mg/dL (ref 0.57–1.00)
Globulin, Total: 2.4 g/dL (ref 1.5–4.5)
Glucose: 91 mg/dL (ref 70–99)
Potassium: 4.8 mmol/L (ref 3.5–5.2)
Sodium: 140 mmol/L (ref 134–144)
Total Protein: 6.8 g/dL (ref 6.0–8.5)
eGFR: 96 mL/min/{1.73_m2} (ref >59)

## 2022-02-11 LAB — LIPID PANEL
Chol/HDL Ratio: 3.7 ratio (ref 0.0–4.4)
Cholesterol, Total: 201 mg/dL — ABNORMAL HIGH (ref 100–199)
HDL: 54 mg/dL (ref >39)
LDL Chol Calc (NIH): 117 mg/dL — ABNORMAL HIGH (ref 0–99)
Triglycerides: 170 mg/dL — ABNORMAL HIGH (ref 0–149)
VLDL Cholesterol Cal: 30 mg/dL (ref 5–40)

## 2022-02-11 LAB — TSH: TSH: 2.72 u[IU]/mL (ref 0.450–4.500)

## 2022-02-11 LAB — FSH/LH
FSH: 4.6 m[IU]/mL
LH: 5.4 m[IU]/mL

## 2022-02-12 ENCOUNTER — Encounter: Payer: Self-pay | Admitting: Obstetrics and Gynecology

## 2022-03-19 ENCOUNTER — Encounter: Payer: Self-pay | Admitting: Family Medicine

## 2022-03-29 ENCOUNTER — Ambulatory Visit: Payer: Managed Care, Other (non HMO) | Admitting: Dermatology

## 2022-03-29 DIAGNOSIS — Z1283 Encounter for screening for malignant neoplasm of skin: Secondary | ICD-10-CM

## 2022-03-29 DIAGNOSIS — D225 Melanocytic nevi of trunk: Secondary | ICD-10-CM | POA: Diagnosis not present

## 2022-03-29 DIAGNOSIS — Q825 Congenital non-neoplastic nevus: Secondary | ICD-10-CM | POA: Diagnosis not present

## 2022-03-29 DIAGNOSIS — Z86006 Personal history of melanoma in-situ: Secondary | ICD-10-CM | POA: Diagnosis not present

## 2022-03-29 DIAGNOSIS — D492 Neoplasm of unspecified behavior of bone, soft tissue, and skin: Secondary | ICD-10-CM

## 2022-03-29 DIAGNOSIS — L820 Inflamed seborrheic keratosis: Secondary | ICD-10-CM | POA: Diagnosis not present

## 2022-03-29 DIAGNOSIS — L700 Acne vulgaris: Secondary | ICD-10-CM | POA: Diagnosis not present

## 2022-03-29 DIAGNOSIS — L719 Rosacea, unspecified: Secondary | ICD-10-CM

## 2022-03-29 DIAGNOSIS — L409 Psoriasis, unspecified: Secondary | ICD-10-CM

## 2022-03-29 DIAGNOSIS — D239 Other benign neoplasm of skin, unspecified: Secondary | ICD-10-CM

## 2022-03-29 HISTORY — DX: Other benign neoplasm of skin, unspecified: D23.9

## 2022-03-29 NOTE — Progress Notes (Signed)
? ?Follow-Up Visit ?  ?Subjective  ?Jodi Carpenter is a 41 y.o. female who presents for the following: check spot (Back, noticed ~22mago, painful) and Total body skin exam (Hx of Melanoma IS Left Buttock exised 12/2021). ?The patient presents for Total-Body Skin Exam (TBSE) for skin cancer screening and mole check.  The patient has spots, moles and lesions to be evaluated, some may be new or changing and the patient has concerns that these could be cancer. ? ?The following portions of the chart were reviewed this encounter and updated as appropriate:  ? Tobacco  Allergies  Meds  Problems  Med Hx  Surg Hx  Fam Hx   ?  ?Review of Systems:  No other skin or systemic complaints except as noted in HPI or Assessment and Plan. ? ?Objective  ?Well appearing patient in no apparent distress; mood and affect are within normal limits. ? ?A full examination was performed including scalp, head, eyes, ears, nose, lips, neck, chest, axillae, abdomen, back, buttocks, bilateral upper extremities, bilateral lower extremities, hands, feet, fingers, toes, fingernails, and toenails. All findings within normal limits unless otherwise noted below. ? ?L mid medial scapula superior ?0.7cm irregular brown macule ? ? ? ? ?L mid medial scapula inferior ?1.1cm irregular brown macule ? ? ? ? ?Low back spinal ?0.7cm brown pap ? ? ? ? ?Right Forearm ?Vascular macule ? ?L scalp x 1 ?Stuck on waxy paps with erythema ? ?face ?Erythema and telangiectasias face ? ?Left Elbow - Posterior ?Pink plaque L elbow ? ? ?Assessment & Plan  ? ?Lentigines ?- Scattered tan macules ?- Due to sun exposure ?- Benign-appearing, observe ?- Recommend daily broad spectrum sunscreen SPF 30+ to sun-exposed areas, reapply every 2 hours as needed. ?- Call for any changes ? ?Seborrheic Keratoses ?- Stuck-on, waxy, tan-brown papules and/or plaques  ?- Benign-appearing ?- Discussed benign etiology and prognosis. ?- Observe ?- Call for any changes ? ?Melanocytic  Nevi ?- Tan-brown and/or pink-flesh-colored symmetric macules and papules ?- Benign appearing on exam today ?- Observation ?- Call clinic for new or changing moles ?- Recommend daily use of broad spectrum spf 30+ sunscreen to sun-exposed areas.  ? ?Hemangiomas ?- Red papules ?- Discussed benign nature ?- Observe ?- Call for any changes ? ?Actinic Damage ?- Chronic condition, secondary to cumulative UV/sun exposure ?- diffuse scaly erythematous macules with underlying dyspigmentation ?- Recommend daily broad spectrum sunscreen SPF 30+ to sun-exposed areas, reapply every 2 hours as needed.  ?- Staying in the shade or wearing long sleeves, sun glasses (UVA+UVB protection) and wide brim hats (4-inch brim around the entire circumference of the hat) are also recommended for sun protection.  ?- Call for new or changing lesions. ? ?Skin cancer screening performed today. ? ?History of melanoma in situ ?L buttock ?Excised 12/2021 ?Clear. Observe for recurrence.  No lymphadenopathy.  Call clinic for new or changing lesions.  Recommend regular skin exams, daily broad-spectrum spf 30+ sunscreen use, and photoprotection.   ? ?Neoplasm of skin (3) ?L mid medial scapula superior ?Epidermal / dermal shaving ? ?Lesion diameter (cm):  0.7 ?Informed consent: discussed and consent obtained   ?Timeout: patient name, date of birth, surgical site, and procedure verified   ?Procedure prep:  Patient was prepped and draped in usual sterile fashion ?Prep type:  Isopropyl alcohol ?Anesthesia: the lesion was anesthetized in a standard fashion   ?Anesthetic:  1% lidocaine w/ epinephrine 1-100,000 buffered w/ 8.4% NaHCO3 ?Instrument used: flexible razor blade   ?Hemostasis  achieved with: pressure, aluminum chloride and electrodesiccation   ?Outcome: patient tolerated procedure well   ?Post-procedure details: sterile dressing applied and wound care instructions given   ?Dressing type: bandage and bacitracin   ? ?Specimen 1 - Surgical  pathology ?Differential Diagnosis: D48.5 Nevus vs Dysplastic nevus ?Check Margins: yes ?0.7cm irregular brown macule ? ?L mid medial scapula inferior ?Epidermal / dermal shaving ? ?Lesion diameter (cm):  1.1 ?Informed consent: discussed and consent obtained   ?Timeout: patient name, date of birth, surgical site, and procedure verified   ?Procedure prep:  Patient was prepped and draped in usual sterile fashion ?Prep type:  Isopropyl alcohol ?Anesthesia: the lesion was anesthetized in a standard fashion   ?Anesthetic:  1% lidocaine w/ epinephrine 1-100,000 buffered w/ 8.4% NaHCO3 ?Instrument used: flexible razor blade   ?Hemostasis achieved with: pressure, aluminum chloride and electrodesiccation   ?Outcome: patient tolerated procedure well   ?Post-procedure details: sterile dressing applied and wound care instructions given   ?Dressing type: bandage and bacitracin   ? ?Specimen 2 - Surgical pathology ?Differential Diagnosis: D48.5 Nevus vs Dysplastic Nevus ?Check Margins: yes ?1.1cm irregular brown macule ? ?Low back spinal ?Epidermal / dermal shaving ? ?Lesion diameter (cm):  0.7 ?Informed consent: discussed and consent obtained   ?Timeout: patient name, date of birth, surgical site, and procedure verified   ?Procedure prep:  Patient was prepped and draped in usual sterile fashion ?Prep type:  Isopropyl alcohol ?Anesthesia: the lesion was anesthetized in a standard fashion   ?Anesthetic:  1% lidocaine w/ epinephrine 1-100,000 buffered w/ 8.4% NaHCO3 ?Instrument used: flexible razor blade   ?Hemostasis achieved with: pressure, aluminum chloride and electrodesiccation   ?Outcome: patient tolerated procedure well   ?Post-procedure details: sterile dressing applied and wound care instructions given   ?Dressing type: bandage and bacitracin   ? ?Specimen 3 - Surgical pathology ?Differential Diagnosis: D48.5 Irritated nevus r/o Atypia ?Check Margins: yes ?0.7cm brown pap ? ?Vascular birthmark ?Right  Forearm ?Benign-appearing.  Observation.  Call clinic for new or changing lesions.  Recommend daily use of broad spectrum spf 30+ sunscreen to sun-exposed areas.  ? ?Inflamed seborrheic keratosis ?L scalp x 1 ?Destruction of lesion - L scalp x 1 ?Complexity: simple   ?Destruction method: cryotherapy   ?Informed consent: discussed and consent obtained   ?Timeout:  patient name, date of birth, surgical site, and procedure verified ?Lesion destroyed using liquid nitrogen: Yes   ?Region frozen until ice ball extended beyond lesion: Yes   ?Outcome: patient tolerated procedure well with no complications   ?Post-procedure details: wound care instructions given   ? ?Rosacea ?face ?Rosacea is a chronic progressive skin condition usually affecting the face of adults, causing redness and/or acne bumps. It is treatable but not curable. It sometimes affects the eyes (ocular rosacea) as well. It may respond to topical and/or systemic medication and can flare with stress, sun exposure, alcohol, exercise and some foods.  Daily application of broad spectrum spf 30+ sunscreen to face is recommended to reduce flares. ? ?Discussed Rhofade, Mirvaso, SM cream ? ?[facility] Oxymetazoline: 1%, Ivermectin: 1%, Niacinamide: 2%,Vehicle cr qam to face ? ?Acne vulgaris ?face ?Hx of Isotretinoin in the past ?No treatment at this time ? ?Psoriasis ?Left Elbow - Posterior ?Psoriasis is a chronic non-curable, but treatable genetic/hereditary disease that may have other systemic features affecting other organ systems such as joints (Psoriatic Arthritis). It is associated with an increased risk of inflammatory bowel disease, heart disease, non-alcoholic fatty liver disease, and depression.    ? ?  Discussed topical treatment. ?Pt said controlled with taking collagen.  ? ?Skin cancer screening ? ?Return for as scheduled w/ Dr. Laurence Ferrari for TBSE hx of , Hx of Melanoma IS. ? ?I, Othelia Pulling, RMA, am acting as scribe for Sarina Ser, MD  . ?Documentation: I have reviewed the above documentation for accuracy and completeness, and I agree with the above. ? ?Sarina Ser, MD ? ?

## 2022-03-29 NOTE — Patient Instructions (Addendum)
?Wound Care Instructions ? ?Cleanse wound gently with soap and water once a day then pat dry with clean gauze. Apply a thing coat of Petrolatum (petroleum jelly, "Vaseline") over the wound (unless you have an allergy to this). We recommend that you use a new, sterile tube of Vaseline. Do not pick or remove scabs. Do not remove the yellow or white "healing tissue" from the base of the wound. ? ?Cover the wound with fresh, clean, nonstick gauze and secure with paper tape. You may use Band-Aids in place of gauze and tape if the would is small enough, but would recommend trimming much of the tape off as there is often too much. Sometimes Band-Aids can irritate the skin. ? ?You should call the office for your biopsy report after 1 week if you have not already been contacted. ? ?If you experience any problems, such as abnormal amounts of bleeding, swelling, significant bruising, significant pain, or evidence of infection, please call the office immediately. ? ?FOR ADULT SURGERY PATIENTS: If you need something for pain relief you may take 1 extra strength Tylenol (acetaminophen) AND 2 Ibuprofen ('200mg'$  each) together every 4 hours as needed for pain. (do not take these if you are allergic to them or if you have a reason you should not take them.) Typically, you may only need pain medication for 1 to 3 days.  ? ?Instructions for [facility] Medications ? ?One or more of your medications was sent to the [facility] mail order compounding pharmacy. You will receive an email from them and can purchase the medicine through that link. It will then be mailed to your home at the address you confirmed. If for any reason you do not receive an email from them, please check your spam folder. If you still do not find the email, please let us know. [facility] phone number is [phone].  ? ? ?Seborrheic Keratosis ? ?What causes seborrheic keratoses? ?Seborrheic keratoses are harmless, common skin growths that first appear  during adult life.  As time goes by, more growths appear.  Some people may develop a large number of them.  Seborrheic keratoses appear on both covered and uncovered body parts.  They are not caused by sunlight.  The tendency to develop seborrheic keratoses can be inherited.  They vary in color from skin-colored to gray, brown, or even black.  They can be either smooth or have a rough, warty surface.   ?Seborrheic keratoses are superficial and look as if they were stuck on the skin.  Under the microscope this type of keratosis looks like layers upon layers of skin.  That is why at times the top layer may seem to fall off, but the rest of the growth remains and re-grows.   ? ?Treatment ?Seborrheic keratoses do not need to be treated, but can easily be removed in the office.  Seborrheic keratoses often cause symptoms when they rub on clothing or jewelry.  Lesions can be in the way of shaving.  If they become inflamed, they can cause itching, soreness, or burning.  Removal of a seborrheic keratosis can be accomplished by freezing, burning, or surgery. ?If any spot bleeds, scabs, or grows rapidly, please return to have it checked, as these can be an indication of a skin cancer. ?If You Need Anything After Your Visit ? ?If you have any questions or concerns for your doctor, please call our main line at [phone] and press option 4 to reach your doctor's medical assistant. If no one  answers, please leave a voicemail as directed and we will return your call as soon as possible. Messages left after 4 pm will be answered the following business day.  ? ?You may also send [place] a message via MyChart. We typically respond to MyChart messages within 1-2 business days. ? ?For prescription refills, please ask your pharmacy to contact our office. Our fax number is [phone]. ? ?If you have an urgent issue when the clinic is closed that cannot wait until the next business day, you can page your doctor at the number below.    ? ?Please note that while we do our best to be available for urgent issues outside of office hours, we are not available 24/7.  ? ?If you have an urgent issue and are unable to reach [place], you may choose to seek medical care at your doctor's office, retail clinic, urgent care center, or emergency room. ? ?If you have a medical emergency, please immediately call 911 or go to the emergency department. ? ?Pager Numbers ? ?- Dr. Nehemiah Massed: [phone] ? ?- Dr. Laurence Ferrari: [phone] ? ?- Dr. Nicole Kindred: [phone] ? ?In the event of inclement weather, please call our main line at [phone] for an update on the status of any delays or closures. ? ?Dermatology Medication Tips: ?Please keep the boxes that topical medications come in in order to help keep track of the instructions about where and how to use these. Pharmacies typically print the medication instructions only on the boxes and not directly on the medication tubes.  ? ?If your medication is too expensive, please contact our office at [phone] option 4 or send [place] a message through [place].  ? ?We are unable to tell what your co-pay for medications will be in advance as this is different depending on your insurance coverage. However, we may be able to find a substitute medication at lower cost or fill out paperwork to get insurance to cover a needed medication.  ? ?If a prior authorization is required to get your medication covered by your insurance company, please allow [place] 1-2 business days to complete this process. ? ?Drug prices often vary depending on where the prescription is filled and some pharmacies may offer cheaper prices. ? ?The website www.goodrx.com contains coupons for medications through different pharmacies. The prices here do not account for what the cost may be with help from insurance (it may be cheaper with your insurance), but the website can give you the price if you did not use any insurance.  ?- You can print the associated coupon and take  it with your prescription to the pharmacy.  ?- You may also stop by our office during regular business hours and pick up a GoodRx coupon card.  ?- If you need your prescription sent electronically to a different pharmacy, notify our office through [facility] or by phone at [phone] option 4. ? ? ? ? ?Si Usted Necesita Algo Despu?s de Su Visita ? ?Tambi?n puede enviarnos un mensaje a trav?s de MyChart. Por lo general respondemos a los mensajes de MyChart en el transcurso de 1 a 2 d?as h?biles. ? ?Para renovar recetas, por favor pida a su farmacia que se ponga en contacto con nuestra oficina. Nuestro n?mero de fax es el [phone]. ? ?Si tiene un asunto urgente cuando la cl?nica est? cerrada y que no puede esperar hasta el siguiente d?a h?bil, puede llamar/localizar a su doctor(a) al n?mero que aparece a continuaci?n.  ? ?Por favor, tenga en cuenta que  aunque hacemos todo lo posible para estar disponibles para asuntos urgentes fuera del horario de oficina, no estamos disponibles las 24 horas del d?a, los 7 d?as de la semana.  ? ?Si tiene un problema urgente y no puede comunicarse con nosotros, puede optar por buscar atenci?n m?dica  en el consultorio de su doctor(a), en una cl?nica privada, en un centro de atenci?n urgente o en una sala de emergencias. ? ?Si tiene Engineer, maintenance (IT) m?dica, por favor llame inmediatamente al 911 o vaya a la sala de emergencias. ? ?N?meros de b?per ? ?- Dr. Nehemiah Massed: [phone] ? ?- Dra. Moye: [phone] ? ?- Dra. Nicole Kindred: [phone] ? ?En caso de inclemencias del tiempo, por favor llame a nuestra l?nea principal al [phone] para una actualizaci?n sobre el estado de cualquier retraso o cierre. ? ?Consejos para la medicaci?n en dermatolog?a: ?Por favor, guarde las cajas en las que vienen los medicamentos de uso t?pico para ayudarle a seguir las instrucciones sobre d?nde y c?mo usarlos. Las farmacias generalmente imprimen las instrucciones del medicamento s?lo en las  cajas y no directamente en los tubos del [place].  ? ?Si su medicamento es muy caro, por favor, p?ngase en contacto con Zigmund Daniel llamando al [phone] y presione la opci?n 4 o env?enos un me

## 2022-04-03 ENCOUNTER — Telehealth: Payer: Self-pay

## 2022-04-03 ENCOUNTER — Other Ambulatory Visit: Payer: Self-pay

## 2022-04-03 MED ORDER — METFORMIN HCL 500 MG PO TABS: 500.0000 mg | ORAL_TABLET | Freq: Every day | ORAL | 3 refills | Status: DC

## 2022-04-03 MED ORDER — METFORMIN HCL 500 MG PO TABS: 500.0000 mg | ORAL_TABLET | Freq: Two times a day (BID) | ORAL | 3 refills | Status: DC

## 2022-04-03 NOTE — Telephone Encounter (Signed)
-----   Message from Ralene Bathe, MD sent at 03/30/2022  5:26 PM EDT ----- ?Diagnosis ?1. Skin , left mid medial scapula superior ?MELANOCYTIC NEVUS, COMPOUND TYPE, BASE INVOLVED ?2. Skin , left mid medial scapula inferior ?DYSPLASTIC COMPOUND NEVUS WITH SEVERE ATYPIA, CLOSE TO MARGIN, SEE DESCRIPTION ?3. Skin , low back spinal ?DYSPLASTIC COMPOUND NEVUS WITH MILD ATYPIA, DEEP MARGIN INVOLVED ? ?1- benign mole ?No further treatment needed ?2- Severe dysplastic ?Schedule surgery ?3- Mild dysplastic ?Recheck next visit ?

## 2022-04-03 NOTE — Telephone Encounter (Signed)
Left message for patient to call office for results/hd 

## 2022-04-04 ENCOUNTER — Telehealth: Payer: Self-pay

## 2022-04-04 NOTE — Telephone Encounter (Signed)
Discussed biopsy results with pt, pt would like Dr Laurence Ferrari to surgically remove her dysplastic nevus with severe Atypia  ? ?left mid medial scapula inferior ?DYSPLASTIC COMPOUND NEVUS WITH SEVERE ATYPIA, CLOSE TO MARGIN, SEE DESCRIPTION ?

## 2022-04-11 ENCOUNTER — Encounter: Payer: Self-pay | Admitting: Dermatology

## 2022-05-17 ENCOUNTER — Ambulatory Visit: Payer: Managed Care, Other (non HMO) | Admitting: Dermatology

## 2022-05-17 ENCOUNTER — Encounter: Payer: Self-pay | Admitting: Dermatology

## 2022-05-17 DIAGNOSIS — D492 Neoplasm of unspecified behavior of bone, soft tissue, and skin: Secondary | ICD-10-CM

## 2022-05-17 DIAGNOSIS — D485 Neoplasm of uncertain behavior of skin: Secondary | ICD-10-CM | POA: Diagnosis not present

## 2022-05-17 NOTE — Progress Notes (Signed)
   Follow-Up Visit   Subjective  Jodi Carpenter is a 41 y.o. female who presents for the following: Procedure (Patient here today for excision of bx proven severe dysplastic nevus at left mid medial scapula inferior.).  Patient accompanied by husband.   The following portions of the chart were reviewed this encounter and updated as appropriate:   Tobacco  Allergies  Meds  Problems  Med Hx  Surg Hx  Fam Hx      Review of Systems:  No other skin or systemic complaints except as noted in HPI or Assessment and Plan.  Objective  Well appearing patient in no apparent distress; mood and affect are within normal limits.  A focused examination was performed including back. Relevant physical exam findings are noted in the Assessment and Plan.  left mid medial scapula inferior Pink scar    Assessment & Plan  Neoplasm of skin left mid medial scapula inferior  Skin excision  Lesion length (cm):  1.2 Margin per side (cm):  0.5 Total excision diameter (cm):  2.2 Informed consent: discussed and consent obtained   Timeout: patient name, date of birth, surgical site, and procedure verified   Procedure prep:  Patient was prepped and draped in usual sterile fashion Prep type:  Chlorhexidine Anesthesia: the lesion was anesthetized in a standard fashion   Anesthetic:  1% lidocaine w/ epinephrine 1-100,000 buffered w/ 8.4% NaHCO3 Instrument used: #10 blade   Hemostasis achieved with: pressure and electrodesiccation   Additional details:  Medial tip tagged with suture  Skin repair Complexity:  Intermediate Final length (cm):  5.2 Informed consent: discussed and consent obtained   Timeout: patient name, date of birth, surgical site, and procedure verified   Procedure prep:  Patient was prepped and draped in usual sterile fashion Prep type:  Chlorhexidine Anesthesia: the lesion was anesthetized in a standard fashion   Anesthetic:  1% lidocaine w/ epinephrine 1-100,000 local  infiltration Reason for type of repair: reduce tension to allow closure, reduce the risk of dehiscence, infection, and necrosis, preserve normal anatomy and preserve normal anatomical and functional relationships   Undermining: edges undermined   Subcutaneous layers (deep stitches):  Suture size:  3-0 and 4-0 Suture type: Vicryl (polyglactin 910) and PDS (polydioxanone)   Stitches:  Buried vertical mattress Fine/surface layer approximation (top stitches):  Suture size:  4-0 Suture type: Prolene (polypropylene)   Stitches: simple running   Suture removal (days):  7 Hemostasis achieved with: suture, pressure and electrodesiccation Outcome: patient tolerated procedure well with no complications   Post-procedure details: wound care instructions given   Additional details:  Mupirocin and a pressure dressing applied  Specimen 1 - Surgical pathology Differential Diagnosis: Bx proven severe dysplastic nevus  Check Margins: yes Pink bx site 1.2cm [id number] Medial tip tagged with suture   Return in about 1 week (around 05/24/2022) for Suture Removal.  Graciella Belton, RMA, am acting as scribe for Forest Gleason, MD .  Documentation: I have reviewed the above documentation for accuracy and completeness, and I agree with the above.  Forest Gleason, MD

## 2022-05-17 NOTE — Patient Instructions (Signed)
Wound Care Instructions for After Surgery  On the day following your surgery, you should begin doing daily dressing changes until your sutures are removed: Remove the bandage. Cleanse the wound gently with soap and water.  Make sure you then dry the skin surrounding the wound completely or the tape will not stick to the skin. Do not use cotton balls on the wound. After the wound is clean and dry, apply the ointment (either prescription antibiotic prescribed by your doctor or plain Vaseline if nothing was prescribed) gently with a Q-tip. If you are using a bandaid to cover: Apply a bandaid large enough to cover the entire wound. If you do not have a bandaid large enough to cover the wound OR if you are sensitive to bandaid adhesive: Cut a non-stick pad (such as Telfa) to fit the size of the wound.  Cover the wound with the non-stick pad. If the wound is draining, you may want to add a small amount of gauze on top of the non-stick pad for a little added compression to the area. Use tape to seal the area completely.  For the next 1-2 weeks: Be sure to keep the wound moist with ointment 24/7 to ensure best healing. If you are unable to cover the wound with a bandage to hold the ointment in place, you may need to reapply the ointment several times a day. Do not bend over or lift heavy items to reduce the chance of elevated blood pressure to the wound. Do not participate in particularly strenuous activities.  Below is a list of dressing supplies you might need.  Cotton-tipped applicators - Q-tips Gauze pads (2x2 and/or 4x4) - All-Purpose Sponges New and clean tube of petroleum jelly (Vaseline) OR prescription antibiotic ointment if prescribed Either a bandaid large enough to cover the entire wound OR non-stick dressing material (Telfa) and Tape (Paper or Hypafix)  FOR ADULT SURGERY PATIENTS: If you need something for pain relief, you may take 1 extra strength Tylenol (acetaminophen) and 2  ibuprofen (200 mg) together every 4 hours as needed. (Do not take these medications if you are allergic to them or if you know you cannot take them for any other reason). Typically you may only need pain medication for 1-3 days.   Comments on the Post-Operative Period Slight swelling and redness often appear around the wound. This is normal and will disappear within several days following the surgery. The healing wound will drain a brownish-red-yellow discharge during healing. This is a normal phase of wound healing. As the wound begins to heal, the drainage may increase in amount. Again, this drainage is normal. Notify us if the drainage becomes persistently bloody, excessively swollen, or intensely painful or develops a foul odor or red streaks.  The healing wound will also typically be itchy. This is normal. If you have severe or persistent pain, Notify us if the discomfort is severe or persistent. Avoid alcoholic beverages when taking pain medicine.  In Case of Wound Hemorrhage A wound hemorrhage is when the bandage suddenly becomes soaked with bright red blood and flows profusely. If this happens, sit down or lie down with your head elevated. If the wound has a dressing on it, do not remove the dressing. Apply pressure to the existing gauze. If the wound is not covered, use a gauze pad to apply pressure and continue applying the pressure for 20 minutes without peeking. DO NOT COVER THE WOUND WITH A LARGE TOWEL OR WASH CLOTH. Release your hand from the   wound site but do not remove the dressing. If the bleeding has stopped, gently clean around the wound. Leave the dressing in place for 24 hours if possible. This wait time allows the blood vessels to close off so that you do not spark a new round of bleeding by disrupting the newly clotted blood vessels with an immediate dressing change. If the bleeding does not subside, continue to hold pressure for 40 minutes. If bleeding continues, page your  physician, contact an After Hours clinic or go to the Emergency Room.  Due to recent changes in healthcare laws, you may see results of your pathology and/or laboratory studies on MyChart before the doctors have had a chance to review them. We understand that in some cases there may be results that are confusing or concerning to you. Please understand that not all results are received at the same time and often the doctors may need to interpret multiple results in order to provide you with the best plan of care or course of treatment. Therefore, we ask that you please give us 2 business days to thoroughly review all your results before contacting the office for clarification. Should we see a critical lab result, you will be contacted sooner.   If You Need Anything After Your Visit  If you have any questions or concerns for your doctor, please call our main line at [phone] and press option 4 to reach your doctor's medical assistant. If no one answers, please leave a voicemail as directed and we will return your call as soon as possible. Messages left after 4 pm will be answered the following business day.   You may also send us a message via MyChart. We typically respond to MyChart messages within 1-2 business days.  For prescription refills, please ask your pharmacy to contact our office. Our fax number is [phone].  If you have an urgent issue when the clinic is closed that cannot wait until the next business day, you can page your doctor at the number below.    Please note that while we do our best to be available for urgent issues outside of office hours, we are not available 24/7.   If you have an urgent issue and are unable to reach us, you may choose to seek medical care at your doctor's office, retail clinic, urgent care center, or emergency room.  If you have a medical emergency, please immediately call 911 or go to the emergency department.  Pager Numbers  - Dr. Kowalski:  [phone]  - Dr. Moye: [phone]  - Dr. Stewart: [phone]  In the event of inclement weather, please call our main line at [phone] for an update on the status of any delays or closures.  Dermatology Medication Tips: Please keep the boxes that topical medications come in in order to help keep track of the instructions about where and how to use these. Pharmacies typically print the medication instructions only on the boxes and not directly on the medication tubes.   If your medication is too expensive, please contact our office at [phone] option 4 or send us a message through MyChart.   We are unable to tell what your co-pay for medications will be in advance as this is different depending on your insurance coverage. However, we may be able to find a substitute medication at lower cost or fill out paperwork to get insurance to cover a needed medication.   If a prior authorization is required to get your medication covered by   your insurance company, please allow us 1-2 business days to complete this process.  Drug prices often vary depending on where the prescription is filled and some pharmacies may offer cheaper prices.  The website www.goodrx.com contains coupons for medications through different pharmacies. The prices here do not account for what the cost may be with help from insurance (it may be cheaper with your insurance), but the website can give you the price if you did not use any insurance.  - You can print the associated coupon and take it with your prescription to the pharmacy.  - You may also stop by our office during regular business hours and pick up a GoodRx coupon card.  - If you need your prescription sent electronically to a different pharmacy, notify our office through  MyChart or by phone at [phone] option 4.     Si Usted Necesita Algo Despus de Su Visita  Tambin puede enviarnos un mensaje a travs de MyChart. Por lo general  respondemos a los mensajes de MyChart en el transcurso de 1 a 2 das hbiles.  Para renovar recetas, por favor pida a su farmacia que se ponga en contacto con nuestra oficina. Nuestro nmero de fax es el [phone].  Si tiene un asunto urgente cuando la clnica est cerrada y que no puede esperar hasta el siguiente da hbil, puede llamar/localizar a su doctor(a) al nmero que aparece a continuacin.   Por favor, tenga en cuenta que aunque hacemos todo lo posible para estar disponibles para asuntos urgentes fuera del horario de oficina, no estamos disponibles las 24 horas del da, los 7 das de la semana.   Si tiene un problema urgente y no puede comunicarse con nosotros, puede optar por buscar atencin mdica  en el consultorio de su doctor(a), en una clnica privada, en un centro de atencin urgente o en una sala de emergencias.  Si tiene una emergencia mdica, por favor llame inmediatamente al 911 o vaya a la sala de emergencias.  Nmeros de bper  - Dr. Kowalski: [phone]  - Dra. Moye: [phone]  - Dra. Stewart: [phone]  En caso de inclemencias del tiempo, por favor llame a nuestra lnea principal al [phone] para una actualizacin sobre el estado de cualquier retraso o cierre.  Consejos para la medicacin en dermatologa: Por favor, guarde las cajas en las que vienen los medicamentos de uso tpico para ayudarle a seguir las instrucciones sobre dnde y cmo usarlos. Las farmacias generalmente imprimen las instrucciones del medicamento slo en las cajas y no directamente en los tubos del medicamento.   Si su medicamento es muy caro, por favor, pngase en contacto con nuestra oficina llamando al [phone] y presione la opcin 4 o envenos un mensaje a travs de MyChart.   No podemos decirle cul ser su copago por los medicamentos por adelantado ya que esto es diferente dependiendo de la cobertura de su seguro. Sin embargo, es posible que podamos encontrar un  medicamento sustituto a menor costo o llenar un formulario para que el seguro cubra el medicamento que se considera necesario.   Si se requiere una autorizacin previa para que su compaa de seguros cubra su medicamento, por favor permtanos de 1 a 2 das hbiles para completar este proceso.  Los precios de los medicamentos varan con frecuencia dependiendo del lugar de dnde se surte la receta y alguna farmacias pueden ofrecer precios ms baratos.  El sitio web www.goodrx.com tiene cupones para medicamentos de diferentes farmacias. Los precios aqu no tienen   en cuenta lo que podra costar con la ayuda del seguro (puede ser ms barato con su seguro), pero el sitio web puede darle el precio si no utiliz ningn seguro.  - Puede imprimir el cupn correspondiente y llevarlo con su receta a la farmacia.  - Tambin puede pasar por nuestra oficina durante el horario de atencin regular y recoger una tarjeta de cupones de GoodRx.  - Si necesita que su receta se enve electrnicamente a una farmacia diferente, informe a nuestra oficina a travs de MyChart de [facility] o por telfono llamando al [phone] y presione la opcin 4.  

## 2022-05-18 ENCOUNTER — Telehealth: Payer: Self-pay

## 2022-05-18 NOTE — Telephone Encounter (Signed)
Patient sore but doing well following yesterday's surgery. Lurlean Horns., RMA

## 2022-05-25 ENCOUNTER — Ambulatory Visit: Payer: Managed Care, Other (non HMO) | Admitting: Dermatology

## 2022-05-25 ENCOUNTER — Ambulatory Visit (INDEPENDENT_AMBULATORY_CARE_PROVIDER_SITE_OTHER): Payer: Managed Care, Other (non HMO) | Admitting: Dermatology

## 2022-05-25 DIAGNOSIS — D235 Other benign neoplasm of skin of trunk: Secondary | ICD-10-CM

## 2022-05-25 DIAGNOSIS — Z4802 Encounter for removal of sutures: Secondary | ICD-10-CM

## 2022-05-25 DIAGNOSIS — D239 Other benign neoplasm of skin, unspecified: Secondary | ICD-10-CM

## 2022-05-25 NOTE — Patient Instructions (Signed)
Due to recent changes in healthcare laws, you may see results of your pathology and/or laboratory studies on MyChart before the doctors have had a chance to review them. We understand that in some cases there may be results that are confusing or concerning to you. Please understand that not all results are received at the same time and often the doctors may need to interpret multiple results in order to provide you with the best plan of care or course of treatment. Therefore, we ask that you please give us 2 business days to thoroughly review all your results before contacting the office for clarification. Should we see a critical lab result, you will be contacted sooner.   If You Need Anything After Your Visit  If you have any questions or concerns for your doctor, please call our main line at [phone] and press option 4 to reach your doctor's medical assistant. If no one answers, please leave a voicemail as directed and we will return your call as soon as possible. Messages left after 4 pm will be answered the following business day.   You may also send us a message via MyChart. We typically respond to MyChart messages within 1-2 business days.  For prescription refills, please ask your pharmacy to contact our office. Our fax number is [phone].  If you have an urgent issue when the clinic is closed that cannot wait until the next business day, you can page your doctor at the number below.    Please note that while we do our best to be available for urgent issues outside of office hours, we are not available 24/7.   If you have an urgent issue and are unable to reach us, you may choose to seek medical care at your doctor's office, retail clinic, urgent care center, or emergency room.  If you have a medical emergency, please immediately call 911 or go to the emergency department.  Pager Numbers  - Dr. Kowalski: [phone]  - Dr. Moye: [phone]  - Dr. Stewart:  [phone]  In the event of inclement weather, please call our main line at [phone] for an update on the status of any delays or closures.  Dermatology Medication Tips: Please keep the boxes that topical medications come in in order to help keep track of the instructions about where and how to use these. Pharmacies typically print the medication instructions only on the boxes and not directly on the medication tubes.   If your medication is too expensive, please contact our office at [phone] option 4 or send us a message through MyChart.   We are unable to tell what your co-pay for medications will be in advance as this is different depending on your insurance coverage. However, we may be able to find a substitute medication at lower cost or fill out paperwork to get insurance to cover a needed medication.   If a prior authorization is required to get your medication covered by your insurance company, please allow us 1-2 business days to complete this process.  Drug prices often vary depending on where the prescription is filled and some pharmacies may offer cheaper prices.  The website www.goodrx.com contains coupons for medications through different pharmacies. The prices here do not account for what the cost may be with help from insurance (it may be cheaper with your insurance), but the website can give you the price if you did not use any insurance.  - You can print the associated coupon and take it with   your prescription to the pharmacy.  - You may also stop by our office during regular business hours and pick up a GoodRx coupon card.  - If you need your prescription sent electronically to a different pharmacy, notify our office through [facility] MyChart or by phone at [phone] option 4.     Si Usted Necesita Algo Despus de Su Visita  Tambin puede enviarnos un mensaje a travs de MyChart. Por lo general respondemos a los mensajes de MyChart en el transcurso de 1 a 2  das hbiles.  Para renovar recetas, por favor pida a su farmacia que se ponga en contacto con nuestra oficina. Nuestro nmero de fax es el [phone].  Si tiene un asunto urgente cuando la clnica est cerrada y que no puede esperar hasta el siguiente da hbil, puede llamar/localizar a su doctor(a) al nmero que aparece a continuacin.   Por favor, tenga en cuenta que aunque hacemos todo lo posible para estar disponibles para asuntos urgentes fuera del horario de oficina, no estamos disponibles las 24 horas del da, los 7 das de la semana.   Si tiene un problema urgente y no puede comunicarse con nosotros, puede optar por buscar atencin mdica  en el consultorio de su doctor(a), en una clnica privada, en un centro de atencin urgente o en una sala de emergencias.  Si tiene una emergencia mdica, por favor llame inmediatamente al 911 o vaya a la sala de emergencias.  Nmeros de bper  - Dr. Kowalski: [phone]  - Dra. Moye: [phone]  - Dra. Stewart: [phone]  En caso de inclemencias del tiempo, por favor llame a nuestra lnea principal al [phone] para una actualizacin sobre el estado de cualquier retraso o cierre.  Consejos para la medicacin en dermatologa: Por favor, guarde las cajas en las que vienen los medicamentos de uso tpico para ayudarle a seguir las instrucciones sobre dnde y cmo usarlos. Las farmacias generalmente imprimen las instrucciones del medicamento slo en las cajas y no directamente en los tubos del medicamento.   Si su medicamento es muy caro, por favor, pngase en contacto con nuestra oficina llamando al [phone] y presione la opcin 4 o envenos un mensaje a travs de MyChart.   No podemos decirle cul ser su copago por los medicamentos por adelantado ya que esto es diferente dependiendo de la cobertura de su seguro. Sin embargo, es posible que podamos encontrar un medicamento sustituto a menor costo o llenar un formulario para que el  seguro cubra el medicamento que se considera necesario.   Si se requiere una autorizacin previa para que su compaa de seguros cubra su medicamento, por favor permtanos de 1 a 2 das hbiles para completar este proceso.  Los precios de los medicamentos varan con frecuencia dependiendo del lugar de dnde se surte la receta y alguna farmacias pueden ofrecer precios ms baratos.  El sitio web www.goodrx.com tiene cupones para medicamentos de diferentes farmacias. Los precios aqu no tienen en cuenta lo que podra costar con la ayuda del seguro (puede ser ms barato con su seguro), pero el sitio web puede darle el precio si no utiliz ningn seguro.  - Puede imprimir el cupn correspondiente y llevarlo con su receta a la farmacia.  - Tambin puede pasar por nuestra oficina durante el horario de atencin regular y recoger una tarjeta de cupones de GoodRx.  - Si necesita que su receta se enve electrnicamente a una farmacia diferente, informe a nuestra oficina a travs de MyChart de [facility]   o por telfono llamando al [phone] y presione la opcin 4.  

## 2022-05-25 NOTE — Progress Notes (Signed)
   Follow-Up Visit   Subjective  Jodi Carpenter is a 41 y.o. female who presents for the following: Dysplastic nevus, margins free, bx proven (left mid medial scapula inferior, pt presents for suture removal).   The following portions of the chart were reviewed this encounter and updated as appropriate:   Tobacco  Allergies  Meds  Problems  Med Hx  Surg Hx  Fam Hx      Review of Systems:  No other skin or systemic complaints except as noted in HPI or Assessment and Plan.  Objective  Well appearing patient in no apparent distress; mood and affect are within normal limits.  A focused examination was performed including back. Relevant physical exam findings are noted in the Assessment and Plan.  left mid medial scapula inferior Healing excision site    Assessment & Plan  Dysplastic nevus left mid medial scapula inferior  Margins free, bx proven  Encounter for Removal of Sutures - Incision site at the left mid medial scapula inferior is clean, dry and intact - Wound cleansed, sutures removed, wound cleansed and steri strips applied.  - Discussed pathology results showing Dysplastic Nevus margins free  - Patient advised to keep steri-strips dry until they fall off. - Scars remodel for a full year. - Once steri-strips fall off, patient can apply over-the-counter silicone scar cream each night to help with scar remodeling if desired. - Patient advised to call with any concerns or if they notice any new or changing lesions.    Return for as scheduled for f/u.  I, Othelia Pulling, RMA, am acting as scribe for Forest Gleason, MD .  Documentation: I have reviewed the above documentation for accuracy and completeness, and I agree with the above.  Forest Gleason, MD

## 2022-05-30 ENCOUNTER — Encounter: Payer: Self-pay | Admitting: Dermatology

## 2022-06-04 ENCOUNTER — Other Ambulatory Visit: Payer: Self-pay | Admitting: Family Medicine

## 2022-06-04 DIAGNOSIS — E038 Other specified hypothyroidism: Secondary | ICD-10-CM

## 2022-07-20 ENCOUNTER — Encounter: Payer: Self-pay | Admitting: Dermatology

## 2022-07-20 ENCOUNTER — Ambulatory Visit: Payer: BC Managed Care – PPO | Admitting: Dermatology

## 2022-07-20 DIAGNOSIS — D229 Melanocytic nevi, unspecified: Secondary | ICD-10-CM

## 2022-07-20 DIAGNOSIS — Z86006 Personal history of melanoma in-situ: Secondary | ICD-10-CM | POA: Diagnosis not present

## 2022-07-20 DIAGNOSIS — Z1283 Encounter for screening for malignant neoplasm of skin: Secondary | ICD-10-CM | POA: Diagnosis not present

## 2022-07-20 DIAGNOSIS — D492 Neoplasm of unspecified behavior of bone, soft tissue, and skin: Secondary | ICD-10-CM

## 2022-07-20 DIAGNOSIS — L821 Other seborrheic keratosis: Secondary | ICD-10-CM

## 2022-07-20 DIAGNOSIS — L814 Other melanin hyperpigmentation: Secondary | ICD-10-CM

## 2022-07-20 DIAGNOSIS — Z86018 Personal history of other benign neoplasm: Secondary | ICD-10-CM | POA: Diagnosis not present

## 2022-07-20 DIAGNOSIS — L578 Other skin changes due to chronic exposure to nonionizing radiation: Secondary | ICD-10-CM

## 2022-07-20 DIAGNOSIS — D225 Melanocytic nevi of trunk: Secondary | ICD-10-CM

## 2022-07-20 DIAGNOSIS — D1800 Hemangioma unspecified site: Secondary | ICD-10-CM

## 2022-07-20 NOTE — Patient Instructions (Addendum)
Wound Care Instructions  Cleanse wound gently with soap and water once a day then pat dry with clean gauze. Apply a thin coat of Petrolatum (petroleum jelly, "Vaseline") over the wound (unless you have an allergy to this). We recommend that you use a new, sterile tube of Vaseline. Do not pick or remove scabs. Do not remove the yellow or white "healing tissue" from the base of the wound.  Cover the wound with fresh, clean, nonstick gauze and secure with paper tape. You may use Band-Aids in place of gauze and tape if the wound is small enough, but would recommend trimming much of the tape off as there is often too much. Sometimes Band-Aids can irritate the skin.  You should call the office for your biopsy report after 1 week if you have not already been contacted.  If you experience any problems, such as abnormal amounts of bleeding, swelling, significant bruising, significant pain, or evidence of infection, please call the office immediately.  FOR ADULT SURGERY PATIENTS: If you need something for pain relief you may take 1 extra strength Tylenol (acetaminophen) AND 2 Ibuprofen (200mg each) together every 4 hours as needed for pain. (do not take these if you are allergic to them or if you have a reason you should not take them.) Typically, you may only need pain medication for 1 to 3 days.       Recommend taking Heliocare sun protection supplement daily in sunny weather for additional sun protection. For maximum protection on the sunniest days, you can take up to 2 capsules of regular Heliocare OR take 1 capsule of Heliocare Ultra. For prolonged exposure (such as a full day in the sun), you can repeat your dose of the supplement 4 hours after your first dose. Heliocare can be purchased at [facility], at some [facility] or at www.[email or web address].     Recommend daily broad spectrum sunscreen SPF 30+ to sun-exposed areas, reapply every 2 hours as needed. Call for new or changing lesions.   Staying in the shade or wearing long sleeves, sun glasses (UVA+UVB protection) and wide brim hats (4-inch brim around the entire circumference of the hat) are also recommended for sun protection.    Melanoma ABCDEs  Melanoma is the most dangerous type of skin cancer, and is the leading cause of death from skin disease.  You are more likely to develop melanoma if you: Have light-colored skin, light-colored eyes, or red or blond hair Spend a lot of time in the sun Tan regularly, either outdoors or in a tanning bed Have had blistering sunburns, especially during childhood Have a close family member who has had a melanoma Have atypical moles or large birthmarks  Early detection of melanoma is key since treatment is typically straightforward and cure rates are extremely high if we catch it early.   The first sign of melanoma is often a change in a mole or a new dark spot.  The ABCDE system is a way of remembering the signs of melanoma.  A for asymmetry:  The two halves do not match. B for border:  The edges of the growth are irregular. C for color:  A mixture of colors are present instead of an even brown color. D for diameter:  Melanomas are usually (but not always) greater than 6mm - the size of a pencil eraser. E for evolution:  The spot keeps changing in size, shape, and color.  Please check your skin once per month between visits. You can use   a small mirror in front and a large mirror behind you to keep an eye on the back side or your body.   If you see any new or changing lesions before your next follow-up, please call to schedule a visit.  Please continue daily skin protection including broad spectrum sunscreen SPF 30+ to sun-exposed areas, reapplying every 2 hours as needed when you're outdoors.   Staying in the shade or wearing long sleeves, sun glasses (UVA+UVB protection) and wide brim hats (4-inch brim around the entire circumference of the hat) are also recommended for sun  protection.    Due to recent changes in healthcare laws, you may see results of your pathology and/or laboratory studies on MyChart before the doctors have had a chance to review them. We understand that in some cases there may be results that are confusing or concerning to you. Please understand that not all results are received at the same time and often the doctors may need to interpret multiple results in order to provide you with the best plan of care or course of treatment. Therefore, we ask that you please give us 2 business days to thoroughly review all your results before contacting the office for clarification. Should we see a critical lab result, you will be contacted sooner.   If You Need Anything After Your Visit  If you have any questions or concerns for your doctor, please call our main line at [phone] and press option 4 to reach your doctor's medical assistant. If no one answers, please leave a voicemail as directed and we will return your call as soon as possible. Messages left after 4 pm will be answered the following business day.   You may also send us a message via MyChart. We typically respond to MyChart messages within 1-2 business days.  For prescription refills, please ask your pharmacy to contact our office. Our fax number is [phone].  If you have an urgent issue when the clinic is closed that cannot wait until the next business day, you can page your doctor at the number below.    Please note that while we do our best to be available for urgent issues outside of office hours, we are not available 24/7.   If you have an urgent issue and are unable to reach us, you may choose to seek medical care at your doctor's office, retail clinic, urgent care center, or emergency room.  If you have a medical emergency, please immediately call 911 or go to the emergency department.  Pager Numbers  - Dr. Kowalski: [phone]  - Dr. Moye: [phone]  - Dr. Stewart:  [phone]  In the event of inclement weather, please call our main line at [phone] for an update on the status of any delays or closures.  Dermatology Medication Tips: Please keep the boxes that topical medications come in in order to help keep track of the instructions about where and how to use these. Pharmacies typically print the medication instructions only on the boxes and not directly on the medication tubes.   If your medication is too expensive, please contact our office at [phone] option 4 or send us a message through MyChart.   We are unable to tell what your co-pay for medications will be in advance as this is different depending on your insurance coverage. However, we may be able to find a substitute medication at lower cost or fill out paperwork to get insurance to cover a needed medication.   If a   prior authorization is required to get your medication covered by your insurance company, please allow us 1-2 business days to complete this process.  Drug prices often vary depending on where the prescription is filled and some pharmacies may offer cheaper prices.  The website www.goodrx.com contains coupons for medications through different pharmacies. The prices here do not account for what the cost may be with help from insurance (it may be cheaper with your insurance), but the website can give you the price if you did not use any insurance.  - You can print the associated coupon and take it with your prescription to the pharmacy.  - You may also stop by our office during regular business hours and pick up a GoodRx coupon card.  - If you need your prescription sent electronically to a different pharmacy, notify our office through [facility] MyChart or by phone at [phone] option 4.     Si Usted Necesita Algo Despus de Su Visita  Tambin puede enviarnos un mensaje a travs de MyChart. Por lo general respondemos a los mensajes de MyChart en el transcurso de 1 a 2  das hbiles.  Para renovar recetas, por favor pida a su farmacia que se ponga en contacto con nuestra oficina. Nuestro nmero de fax es el [phone].  Si tiene un asunto urgente cuando la clnica est cerrada y que no puede esperar hasta el siguiente da hbil, puede llamar/localizar a su doctor(a) al nmero que aparece a continuacin.   Por favor, tenga en cuenta que aunque hacemos todo lo posible para estar disponibles para asuntos urgentes fuera del horario de oficina, no estamos disponibles las 24 horas del da, los 7 das de la semana.   Si tiene un problema urgente y no puede comunicarse con nosotros, puede optar por buscar atencin mdica  en el consultorio de su doctor(a), en una clnica privada, en un centro de atencin urgente o en una sala de emergencias.  Si tiene una emergencia mdica, por favor llame inmediatamente al 911 o vaya a la sala de emergencias.  Nmeros de bper  - Dr. Kowalski: [phone]  - Dra. Moye: [phone]  - Dra. Stewart: [phone]  En caso de inclemencias del tiempo, por favor llame a nuestra lnea principal al [phone] para una actualizacin sobre el estado de cualquier retraso o cierre.  Consejos para la medicacin en dermatologa: Por favor, guarde las cajas en las que vienen los medicamentos de uso tpico para ayudarle a seguir las instrucciones sobre dnde y cmo usarlos. Las farmacias generalmente imprimen las instrucciones del medicamento slo en las cajas y no directamente en los tubos del medicamento.   Si su medicamento es muy caro, por favor, pngase en contacto con nuestra oficina llamando al [phone] y presione la opcin 4 o envenos un mensaje a travs de MyChart.   No podemos decirle cul ser su copago por los medicamentos por adelantado ya que esto es diferente dependiendo de la cobertura de su seguro. Sin embargo, es posible que podamos encontrar un medicamento sustituto a menor costo o llenar un formulario para que el  seguro cubra el medicamento que se considera necesario.   Si se requiere una autorizacin previa para que su compaa de seguros cubra su medicamento, por favor permtanos de 1 a 2 das hbiles para completar este proceso.  Los precios de los medicamentos varan con frecuencia dependiendo del lugar de dnde se surte la receta y alguna farmacias pueden ofrecer precios ms baratos.  El sitio web www.goodrx.com tiene cupones   para medicamentos de diferentes farmacias. Los precios aqu no tienen en cuenta lo que podra costar con la ayuda del seguro (puede ser ms barato con su seguro), pero el sitio web puede darle el precio si no utiliz ningn seguro.  - Puede imprimir el cupn correspondiente y llevarlo con su receta a la farmacia.  - Tambin puede pasar por nuestra oficina durante el horario de atencin regular y recoger una tarjeta de cupones de GoodRx.  - Si necesita que su receta se enve electrnicamente a una farmacia diferente, informe a nuestra oficina a travs de MyChart de [place] o por telfono llamando al [phone] y presione la opcin 4.  

## 2022-07-20 NOTE — Progress Notes (Signed)
Follow-Up Visit   Subjective  Jodi Carpenter is a 41 y.o. female who presents for the following: Annual Exam (Hx of dysplastic nevi. Hx of MIS at left buttock 2023).  The patient presents for Total-Body Skin Exam (TBSE) for skin cancer screening and mole check.  The patient has spots, moles and lesions to be evaluated, some may be new or changing and the patient has concerns that these could be cancer.   The following portions of the chart were reviewed this encounter and updated as appropriate:  Tobacco  Allergies  Meds  Problems  Med Hx  Surg Hx  Fam Hx      Review of Systems: No other skin or systemic complaints except as noted in HPI or Assessment and Plan.   Objective  Well appearing patient in no apparent distress; mood and affect are within normal limits.  A full examination was performed including scalp, head, eyes, ears, nose, lips, neck, chest, axillae, abdomen, back, buttocks, bilateral upper extremities, bilateral lower extremities, hands, feet, fingers, toes, fingernails, and toenails. All findings within normal limits unless otherwise noted below.  low back spinal #2  0.1cm brown macules within scar   Assessment & Plan   History of Melanoma in Situ. Left buttock. Bx: 12/20/2021. Excised: 12/28/2021. - No evidence of recurrence today - Recommend regular full body skin exams - Recommend daily broad spectrum sunscreen SPF 30+ to sun-exposed areas, reapply every 2 hours as needed.  - Call if any new or changing lesions are noted between office visits   History of Dysplastic Nevi. Left mid medial scapula, inf., severe. Excised 05/17/2022. Low back spinal, mild, 03/29/2022. - No evidence of recurrence today - Recommend regular full body skin exams - Recommend daily broad spectrum sunscreen SPF 30+ to sun-exposed areas, reapply every 2 hours as needed.  - Call if any new or changing lesions are noted between office visits   Lentigines - Scattered tan macules -  Due to sun exposure - Benign-appearing, observe - Recommend daily broad spectrum sunscreen SPF 30+ to sun-exposed areas, reapply every 2 hours as needed. - Call for any changes  Seborrheic Keratoses - Stuck-on, waxy, tan-brown papules and/or plaques  - Benign-appearing - Discussed benign etiology and prognosis. - Observe - Call for any changes  Melanocytic Nevi - Tan-brown and/or pink-flesh-colored symmetric macules and papules - Benign appearing on exam today - Observation - Call clinic for new or changing moles - Recommend daily use of broad spectrum spf 30+ sunscreen to sun-exposed areas.   Hemangiomas - Red papules - Discussed benign nature - Observe - Call for any changes  Actinic Damage - Chronic condition, secondary to cumulative UV/sun exposure - diffuse scaly erythematous macules with underlying dyspigmentation - Recommend daily broad spectrum sunscreen SPF 30+ to sun-exposed areas, reapply every 2 hours as needed.  - Staying in the shade or wearing long sleeves, sun glasses (UVA+UVB protection) and wide brim hats (4-inch brim around the entire circumference of the hat) are also recommended for sun protection.  - Call for new or changing lesions.  Skin cancer screening performed today.  Neoplasm of skin low back spinal  Epidermal / dermal shaving  Lesion diameter (cm):  0.5 Informed consent: discussed and consent obtained   Patient was prepped and draped in usual sterile fashion: Area prepped with alcohol. Anesthesia: the lesion was anesthetized in a standard fashion   Anesthetic:  1% lidocaine w/ epinephrine 1-100,000 buffered w/ 8.4% NaHCO3 Instrument used: flexible razor blade   Hemostasis  achieved with: pressure, aluminum chloride and electrodesiccation   Outcome: patient tolerated procedure well   Post-procedure details: wound care instructions given   Post-procedure details comment:  Ointment and small bandage applied  Specimen 1 - Surgical  pathology Differential Diagnosis: R/O recurrent dysplastic nevus  Check Margins: No Previous Pathology: [id number]   Return in about 3 months (around 10/20/2022) for TBSE, HxMIS, HxDN.  I, Emelia Salisbury, CMA, am acting as scribe for Forest Gleason, MD.  Documentation: I have reviewed the above documentation for accuracy and completeness, and I agree with the above.  Forest Gleason, MD

## 2022-07-24 ENCOUNTER — Telehealth: Payer: Self-pay

## 2022-07-24 ENCOUNTER — Encounter: Payer: Self-pay | Admitting: Dermatology

## 2022-07-24 NOTE — Telephone Encounter (Signed)
-----   Message from [place], MD sent at 07/24/2022  4:49 PM EDT ----- Skin , low back spinal RECURRENT MELANOCYTIC NEVUS, LIMITED MARGINS FREE --> recurrent normal-appearing mole, not additional treatment needed, edges appear clear of mole  MAs please call. Thank you!

## 2022-07-24 NOTE — Telephone Encounter (Signed)
Discussed pathology results. Patient voiced understanding. JP

## 2022-07-27 ENCOUNTER — Ambulatory Visit (INDEPENDENT_AMBULATORY_CARE_PROVIDER_SITE_OTHER): Payer: BC Managed Care – PPO | Admitting: Obstetrics & Gynecology

## 2022-07-27 ENCOUNTER — Encounter: Payer: Self-pay | Admitting: Obstetrics & Gynecology

## 2022-07-27 ENCOUNTER — Other Ambulatory Visit (HOSPITAL_COMMUNITY)
Admission: RE | Admit: 2022-07-27 | Discharge: 2022-07-27 | Disposition: A | Discharge status: Home / Self Care | Payer: BC Managed Care – PPO | Source: Ambulatory Visit | Attending: Anesthesiology | Admitting: Anesthesiology

## 2022-07-27 VITALS — BP 110/72 | Wt 240.0 lb

## 2022-07-27 DIAGNOSIS — N760 Acute vaginitis: Secondary | ICD-10-CM | POA: Insufficient documentation

## 2022-07-27 DIAGNOSIS — N898 Other specified noninflammatory disorders of vagina: Secondary | ICD-10-CM

## 2022-07-27 NOTE — Progress Notes (Signed)
Subjective:     Jodi Carpenter is a 41 y.o. G37 P0000 woman who comes in today for a  pelvic exam to evaluate vaginal discharge.  She has tried Monistat with no relief . Her most recent annual exam was on January of 2023. Her most recent Pap smear was on Jan 2023 and showed no abnormalities. Previous abnormal Pap smears: normal. Contraception: none  The following portions of the patient's history were reviewed and updated as appropriate: allergies, current medications, past family history, past medical history, past social history, past surgical history, and problem list.  Review of Systems A comprehensive review of systems was negative.   Objective:    BP 110/72   Wt 240 lb (108.9 kg)   BMI 39.94 kg/m  Pelvic Exam: cervix normal in appearance, external genitalia normal, and scant vaginal discharge .   Assessment:  GYN exam  Screening Vaginal swab of discharge NU swab sent Plan:    Follow up in  for annual  yearly, or as indicated by Pap results.   Rosario Adie, MD  07/27/2022 12:43 PM

## 2022-07-28 LAB — CERVICOVAGINAL ANCILLARY ONLY
Bacterial Vaginitis (gardnerella): POSITIVE — AB
Candida Glabrata: NEGATIVE
Candida Vaginitis: NEGATIVE
Chlamydia: NEGATIVE
Comment: NEGATIVE
Comment: NEGATIVE
Comment: NEGATIVE
Comment: NEGATIVE
Comment: NEGATIVE
Comment: NORMAL
Neisseria Gonorrhea: NEGATIVE
Trichomonas: NEGATIVE

## 2022-08-01 ENCOUNTER — Telehealth: Payer: Self-pay

## 2022-08-01 DIAGNOSIS — B9689 Other specified bacterial agents as the cause of diseases classified elsewhere: Secondary | ICD-10-CM

## 2022-08-01 NOTE — Telephone Encounter (Signed)
Pt calling; has seen results of swab showing bacterial vaginosis; doesn't she need an rx?  [facility].  [phone]

## 2022-08-02 MED ORDER — METRONIDAZOLE 500 MG PO TABS: 500.0000 mg | ORAL_TABLET | Freq: Two times a day (BID) | ORAL | 0 refills | Status: DC

## 2022-08-02 NOTE — Telephone Encounter (Signed)
Spoke with patient. Advised of treatment options: Flagyl or Metrogel. Patient prefers Flagyl. Notified to abstain from alcohol and intercourse during course of treatment. Rx sent.

## 2022-08-14 ENCOUNTER — Ambulatory Visit: Payer: Managed Care, Other (non HMO) | Admitting: Family Medicine

## 2022-09-14 ENCOUNTER — Ambulatory Visit: Payer: BC Managed Care – PPO | Admitting: Dermatology

## 2022-09-14 ENCOUNTER — Encounter: Payer: Self-pay | Admitting: Dermatology

## 2022-09-14 DIAGNOSIS — K130 Diseases of lips: Secondary | ICD-10-CM | POA: Diagnosis not present

## 2022-09-14 DIAGNOSIS — L700 Acne vulgaris: Secondary | ICD-10-CM

## 2022-09-14 MED ORDER — DOXYCYCLINE HYCLATE 20 MG PO TABS: 20.0000 mg | ORAL_TABLET | Freq: Two times a day (BID) | ORAL | 0 refills | Status: DC

## 2022-09-14 MED ORDER — HYDROCORTISONE 2.5 % EX OINT: TOPICAL_OINTMENT | Freq: Two times a day (BID) | CUTANEOUS | 0 refills | Status: DC

## 2022-09-14 MED ORDER — AKLIEF 0.005 % EX CREA: TOPICAL_CREAM | CUTANEOUS | 3 refills | Status: DC

## 2022-09-14 NOTE — Progress Notes (Signed)
   Follow-Up Visit   Subjective  Jodi Carpenter is a 41 y.o. female who presents for the following: Acne (Face. Dur: 2-3 months. Has been using Tretinoin, not helping. Was on testosterone therapy for 1 month, has been off therapy for 1 month) and Rash (C/O chapped and blistered lips. Has been using Dr. Luvenia Heller Cortibalm, not helping).  The following portions of the chart were reviewed this encounter and updated as appropriate:  Tobacco  Allergies  Meds  Problems  Med Hx  Surg Hx  Fam Hx      Review of Systems: No other skin or systemic complaints except as noted in HPI or Assessment and Plan.   Objective  Well appearing patient in no apparent distress; mood and affect are within normal limits.  A focused examination was performed including face. Relevant physical exam findings are noted in the Assessment and Plan.  face Scattered inflammatory papules along jaw line. Trace open comedones  Lips Moderate erythema   Assessment & Plan  Acne vulgaris face  Chronic and persistent condition with duration or expected duration over one year. Condition is bothersome/symptomatic for patient. Currently flared.  Start Doxycycline '20mg'$  1 tablet twice daily with food  Start Aklief at bedtime to face, wash off in morning.   Recommended non-comedogenic (non-acne causing) facial oils include 100% argan oil or squalane. The can be used after applying any recommended creams or ointments to the skin in the evening. [employer] has a high-quality and affordable version of both of these and can be found at [place].   Doxycycline should be taken with food to prevent nausea. Do not lay down for 30 minutes after taking. Be cautious with sun exposure and use good sun protection while on this medication. Pregnant women should not take this medication.   Topical retinoid medications like tretinoin/Retin-A, adapalene/Differin, tazarotene/Fabior, and Epiduo/Epiduo Forte can cause  dryness and irritation when first started. Only apply a pea-sized amount to the entire affected area. Avoid applying it around the eyes, edges of mouth and creases at the nose. If you experience irritation, use a good moisturizer first and/or apply the medicine less often. If you are doing well with the medicine, you can increase how often you use it until you are applying every night. Be careful with sun protection while using this medication as it can make you sensitive to the sun. This medicine should not be used by pregnant women.    Trifarotene (AKLIEF) 0.005 % CREA - face Apply pea-sized amount to face at bedtime, wash off in morning.  doxycycline (PERIOSTAT) 20 MG tablet - face Take 1 tablet (20 mg total) by mouth 2 (two) times daily.  Cheilitis Lips  Ddx irritant vs allergic contact dermatitis > infection  Stop Dr. Luvenia Heller. Do not use any lip balm with Beeswax  Start Hydrocortisone 2.5% ointment twice daily up to 2 weeks.   Use Vaseline Jelly or Aquaphor to lips  Call if not better within 2 weeks  hydrocortisone 2.5 % ointment - Lips Apply topically 2 (two) times daily.   Return for TBSE As Scheduled.  I, Emelia Salisbury, CMA, am acting as scribe for Forest Gleason, MD.  Documentation: I have reviewed the above documentation for accuracy and completeness, and I agree with the above.  Forest Gleason, MD

## 2022-09-14 NOTE — Patient Instructions (Addendum)
Stop Dr. Luvenia Heller. Do not use any lip balm with Bee's Wax  Start Hydrocortisone 2.5% ointment twice daily up to 2 weeks.   Use Vaseline Jelly or Aquaphor to lips   Topical steroids (such as triamcinolone, fluocinolone, fluocinonide, mometasone, clobetasol, halobetasol, betamethasone, hydrocortisone) can cause thinning and lightening of the skin if they are used for too long in the same area. Your physician has selected the right strength medicine for your problem and area affected on the body. Please use your medication only as directed by your physician to prevent side effects.     Start Doxycycline '20mg'$  1 tablet twice daily with food  Start Aklief at bedtime to face, wash off in morning.   Your prescription was sent to [facility] in [place]. A representative from [place] will contact you within 3 business hours to verify your address and insurance information to schedule a free delivery. If for any reason you do not receive a phone call from them, please reach out to them. Their phone number is [phone] and their hours are Monday-Friday 9:00 am-5:00 pm.    Recommended non-comedogenic (non-acne causing) facial oils include 100% argan oil or squalane. The can be used after applying any recommended creams or ointments to the skin in the evening. [employer] has a high-quality and affordable version of both of these and can be found at [place].   Doxycycline should be taken with food to prevent nausea. Do not lay down for 30 minutes after taking. Be cautious with sun exposure and use good sun protection while on this medication. Pregnant women should not take this medication.   Topical retinoid medications like tretinoin/Retin-A, adapalene/Differin, tazarotene/Fabior, and Epiduo/Epiduo Forte can cause dryness and irritation when first started. Only apply a pea-sized amount to the entire affected area. Avoid applying it around the eyes, edges of mouth and creases at  the nose. If you experience irritation, use a good moisturizer first and/or apply the medicine less often. If you are doing well with the medicine, you can increase how often you use it until you are applying every night. Be careful with sun protection while using this medication as it can make you sensitive to the sun. This medicine should not be used by pregnant women.    Due to recent changes in healthcare laws, you may see results of your pathology and/or laboratory studies on MyChart before the doctors have had a chance to review them. We understand that in some cases there may be results that are confusing or concerning to you. Please understand that not all results are received at the same time and often the doctors may need to interpret multiple results in order to provide you with the best plan of care or course of treatment. Therefore, we ask that you please give [place] 2 business days to thoroughly review all your results before contacting the office for clarification. Should we see a critical lab result, you will be contacted sooner.   If You Need Anything After Your Visit  If you have any questions or concerns for your doctor, please call our main line at [phone] and press option 4 to reach your doctor's medical assistant. If no one answers, please leave a voicemail as directed and we will return your call as soon as possible. Messages left after 4 pm will be answered the following business day.   You may also send [place] a message via [place]. We typically respond to MyChart messages within 1-2 business days.  For prescription refills, please ask your pharmacy to contact our office. Our fax number is [phone].  If you have an urgent issue when the clinic is closed that cannot wait until the next business day, you can page your doctor at the number below.    Please note that while we do our best to be available for urgent issues outside of office hours, we are not available 24/7.   If  you have an urgent issue and are unable to reach [place], you may choose to seek medical care at your doctor's office, retail clinic, urgent care center, or emergency room.  If you have a medical emergency, please immediately call 911 or go to the emergency department.  Pager Numbers  - Dr. Nehemiah Massed: [phone]  - Dr. Laurence Ferrari: [phone]  - Dr. Nicole Kindred: [phone]  In the event of inclement weather, please call our main line at [phone] for an update on the status of any delays or closures.  Dermatology Medication Tips: Please keep the boxes that topical medications come in in order to help keep track of the instructions about where and how to use these. Pharmacies typically print the medication instructions only on the boxes and not directly on the medication tubes.   If your medication is too expensive, please contact our office at [phone] option 4 or send [place] a message through [place].   We are unable to tell what your co-pay for medications will be in advance as this is different depending on your insurance coverage. However, we may be able to find a substitute medication at lower cost or fill out paperwork to get insurance to cover a needed medication.   If a prior authorization is required to get your medication covered by your insurance company, please allow [place] 1-2 business days to complete this process.  Drug prices often vary depending on where the prescription is filled and some pharmacies may offer cheaper prices.  The website www.goodrx.com contains coupons for medications through different pharmacies. The prices here do not account for what the cost may be with help from insurance (it may be cheaper with your insurance), but the website can give you the price if you did not use any insurance.  - You can print the associated coupon and take it with your prescription to the pharmacy.  - You may also stop by our office during regular business hours and pick up a GoodRx  coupon card.  - If you need your prescription sent electronically to a different pharmacy, notify our office through [facility] or by phone at [phone] option 4.     Si Usted Necesita Algo Despus de Su Visita  Tambin puede enviarnos un mensaje a travs de Pharmacist, community. Por lo general respondemos a los mensajes de MyChart en el transcurso de 1 a 2 das hbiles.  Para renovar recetas, por favor pida a su farmacia que se ponga en contacto con nuestra oficina. Harland Dingwall de fax es [place] [phone].  Si tiene un asunto urgente cuando la clnica est cerrada y que no puede esperar hasta el siguiente da hbil, puede llamar/localizar a su doctor(a) al nmero que aparece a continuacin.   Por favor, tenga en cuenta que aunque hacemos todo lo posible para estar disponibles para asuntos urgentes fuera del horario de [place], no estamos disponibles las 24 horas del da, los 7 das de la [place].   Si tiene un problema urgente y no puede comunicarse con nosotros, puede optar por buscar atencin mdica  en  el consultorio de su doctor(a), en una clnica privada, en un centro de atencin urgente o en una sala de emergencias.  Si tiene Engineering geologist, por favor llame inmediatamente al 911 o vaya a la sala de emergencias.  Nmeros de bper  - Dr. Nehemiah Massed: [phone]  - Dra. Moye: [phone]  - Dra. Nicole Kindred: [phone]  En caso de inclemencias del [place], por favor llame a Johnsie Kindred principal al [phone] para una actualizacin sobre el [place] de cualquier retraso o cierre.  Consejos para la medicacin en dermatologa: Por favor, guarde las cajas en las que vienen los medicamentos de uso tpico para ayudarle a seguir las instrucciones sobre dnde y cmo usarlos. Las farmacias generalmente imprimen las instrucciones del medicamento slo en las cajas y no directamente en los tubos del [place].   Si su medicamento es muy caro, por favor, pngase en contacto con  Zigmund Daniel llamando al [phone] y presione la opcin 4 o envenos un mensaje a travs de Pharmacist, community.   No podemos decirle cul ser su copago por los medicamentos por adelantado ya que esto es diferente dependiendo de la cobertura de su seguro. Sin embargo, es posible que podamos encontrar un medicamento sustituto a Electrical engineer un formulario para que el seguro cubra el medicamento que se considera necesario.   Si se requiere una autorizacin previa para que su compaa de seguros [place] su medicamento, por favor permtanos de 1 a 2 das hbiles para completar este proceso.  Los precios de los medicamentos varan con frecuencia dependiendo del Environmental consultant de dnde se surte la receta y alguna farmacias pueden ofrecer precios ms baratos.  El sitio web www.goodrx.com tiene cupones para medicamentos de Airline pilot. Los precios aqu no tienen en cuenta lo que podra costar con la ayuda del seguro (puede ser ms barato con su seguro), pero el sitio web puede darle el precio si no utiliz Research scientist (physical sciences).  - Puede imprimir el cupn correspondiente y llevarlo con su receta a la farmacia.  - Tambin puede pasar por nuestra oficina durante el horario de atencin regular y Charity fundraiser una tarjeta de cupones de GoodRx.  - Si necesita que su receta se enve electrnicamente a una farmacia diferente, informe a nuestra oficina a travs de MyChart de [place] o por telfono llamando al [phone] y presione la opcin 4.

## 2022-09-21 ENCOUNTER — Encounter: Payer: Self-pay | Admitting: Dermatology

## 2022-10-24 ENCOUNTER — Other Ambulatory Visit: Payer: Self-pay | Admitting: Dermatology

## 2022-10-24 DIAGNOSIS — L700 Acne vulgaris: Secondary | ICD-10-CM

## 2022-11-08 ENCOUNTER — Encounter: Payer: BC Managed Care – PPO | Admitting: Dermatology

## 2022-12-02 IMAGING — MG MM DIGITAL DIAGNOSTIC UNILAT*L* W/ TOMO W/ CAD
6 series · 6 of 18 positions shown · non-contrast
Comparison: Prior films

CLINICAL DATA: Callback from screening mammogram for possible
asymmetry left breast

EXAM:
DIGITAL DIAGNOSTIC UNILATERAL LEFT MAMMOGRAM WITH TOMOSYNTHESIS AND
CAD; ULTRASOUND LEFT BREAST LIMITED
TECHNIQUE: Left digital diagnostic mammography and breast tomosynthesis was
performed. The images were evaluated with computer-aided detection.;
Targeted ultrasound examination of the left breast was performed.

[L ML synth-2D]
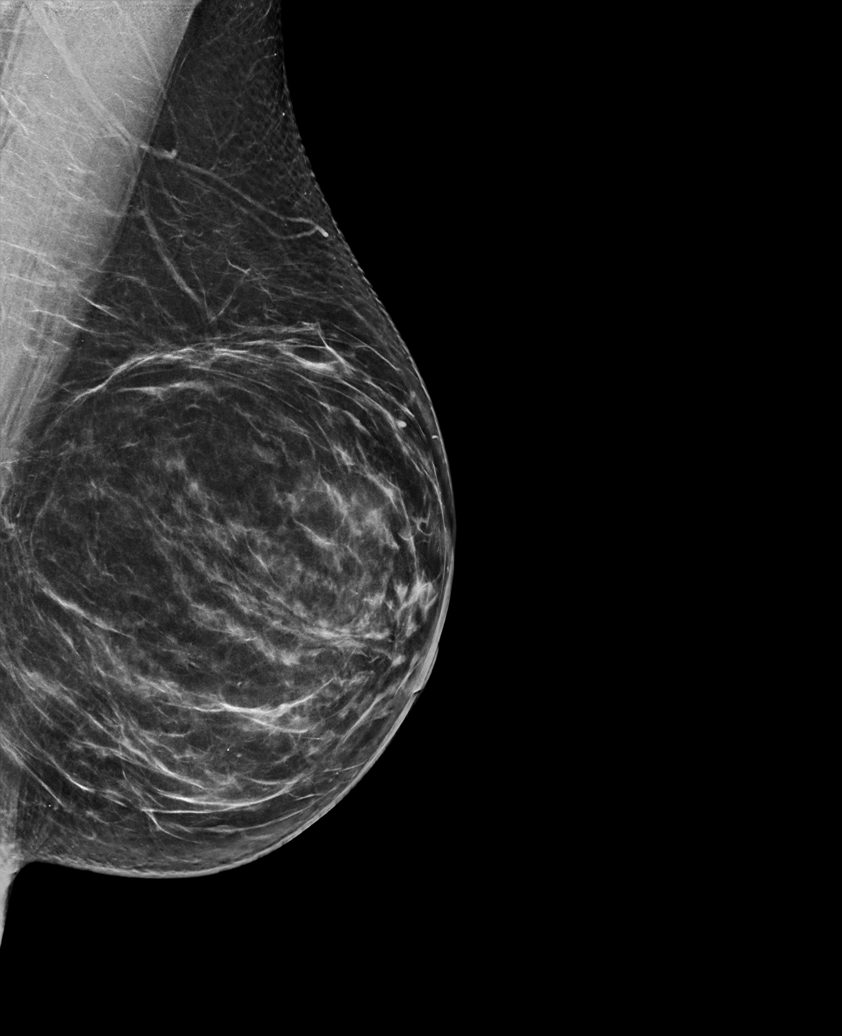

[L CC synth-2D (1 of 2)]
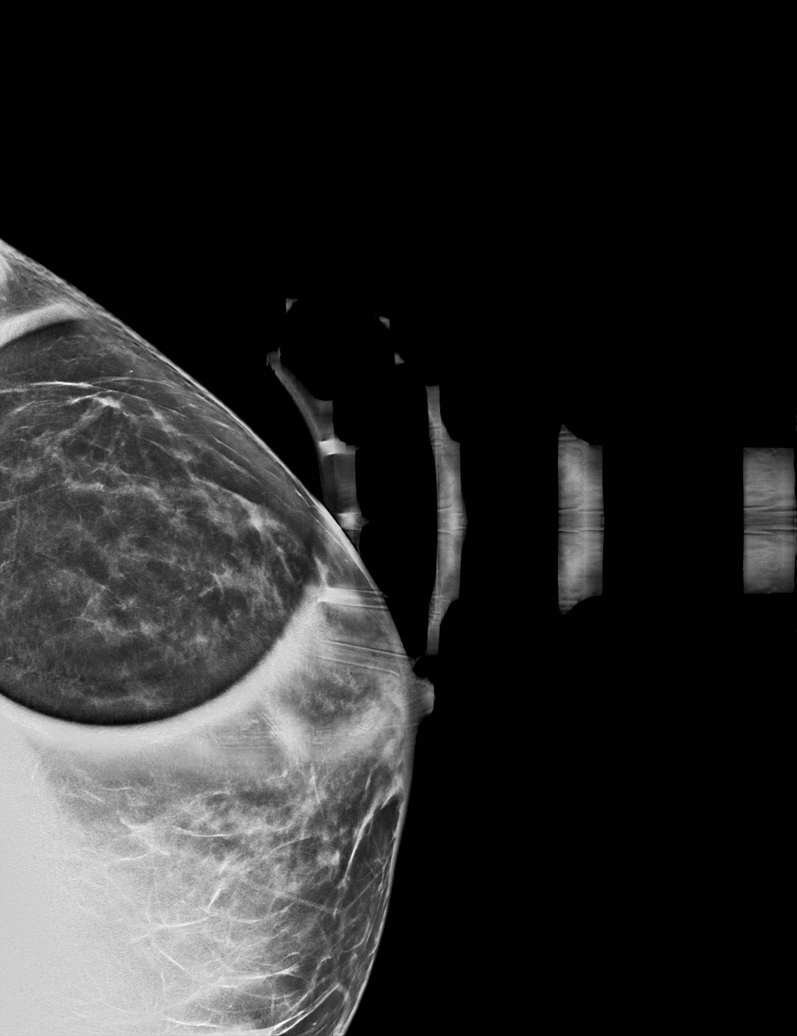

[L CC synth-2D (2 of 2)]
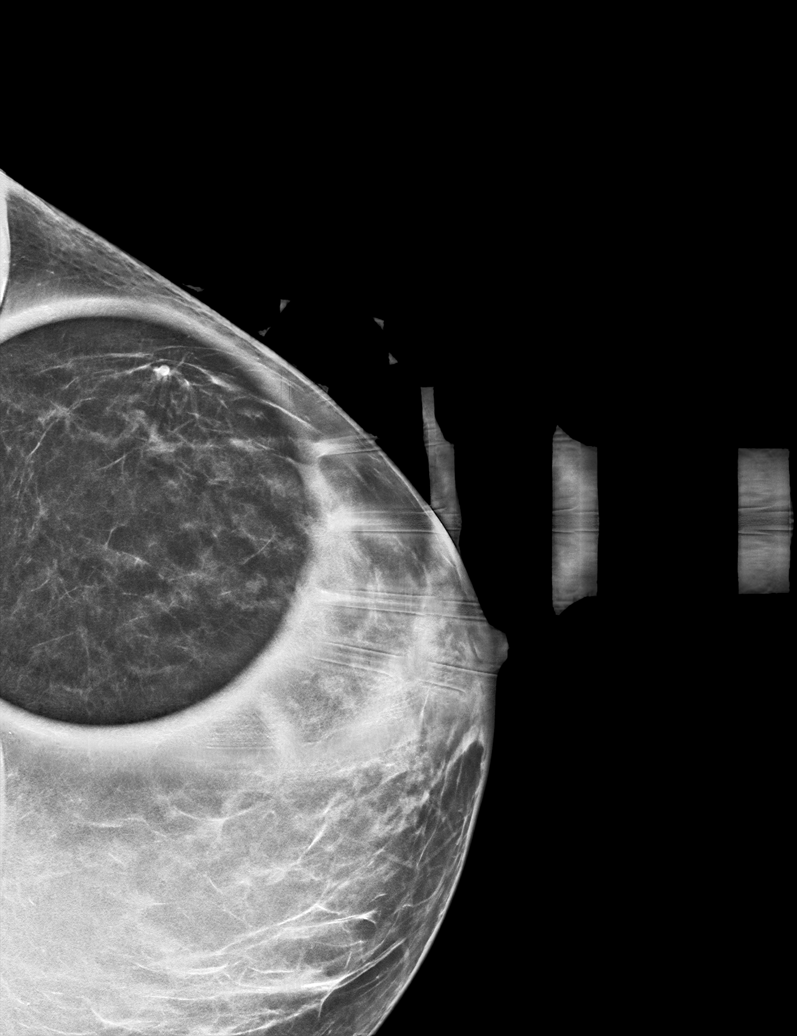

[L CC tomo (1 of 2) · tomo slice 32/63.0]
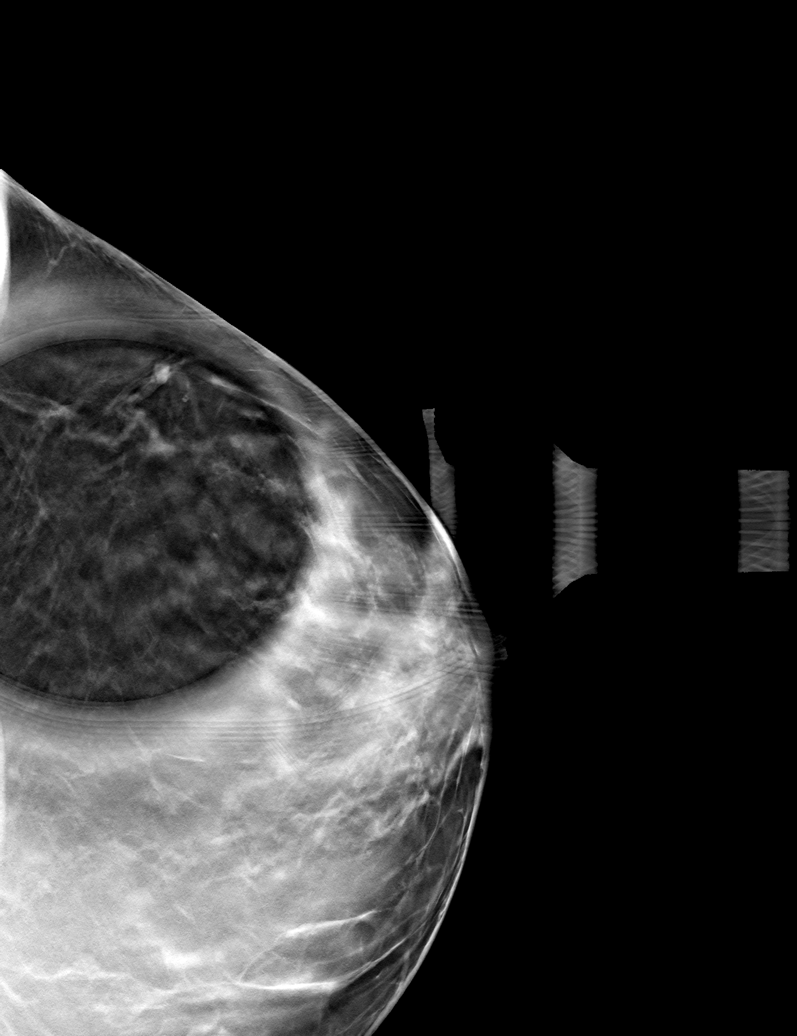

[L ML tomo · tomo slice 41/81.0]
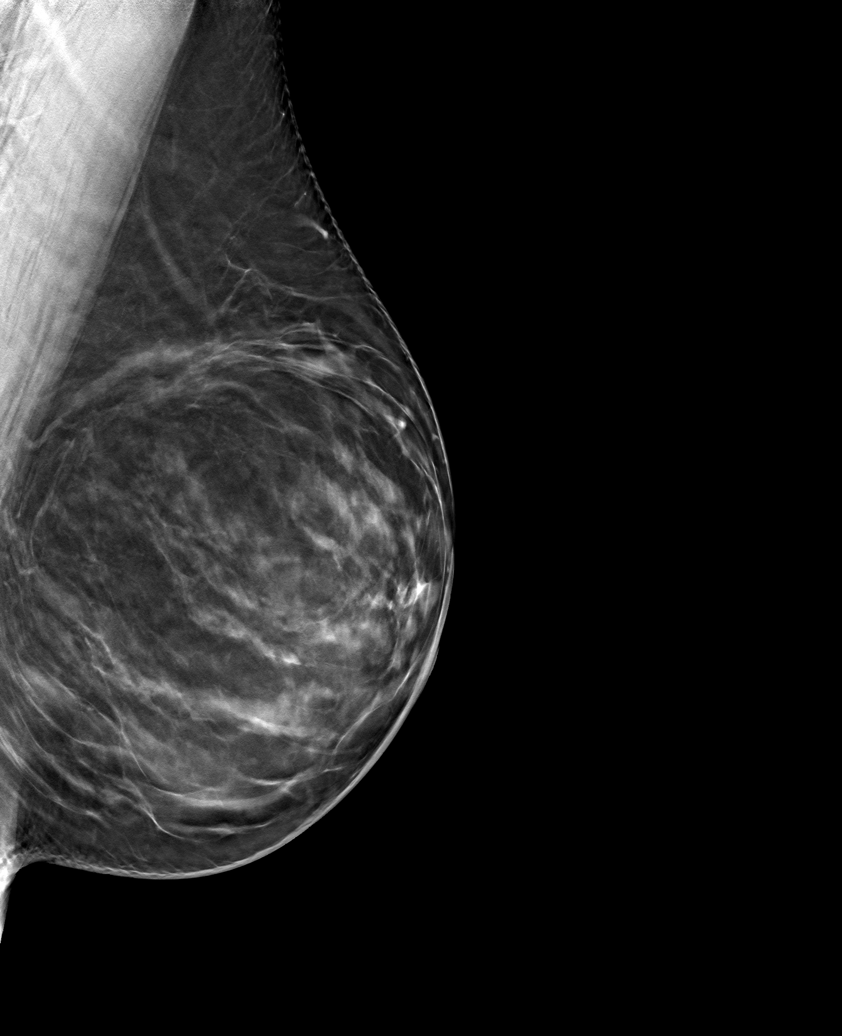

[L CC tomo (2 of 2) · tomo slice 33/64.0]
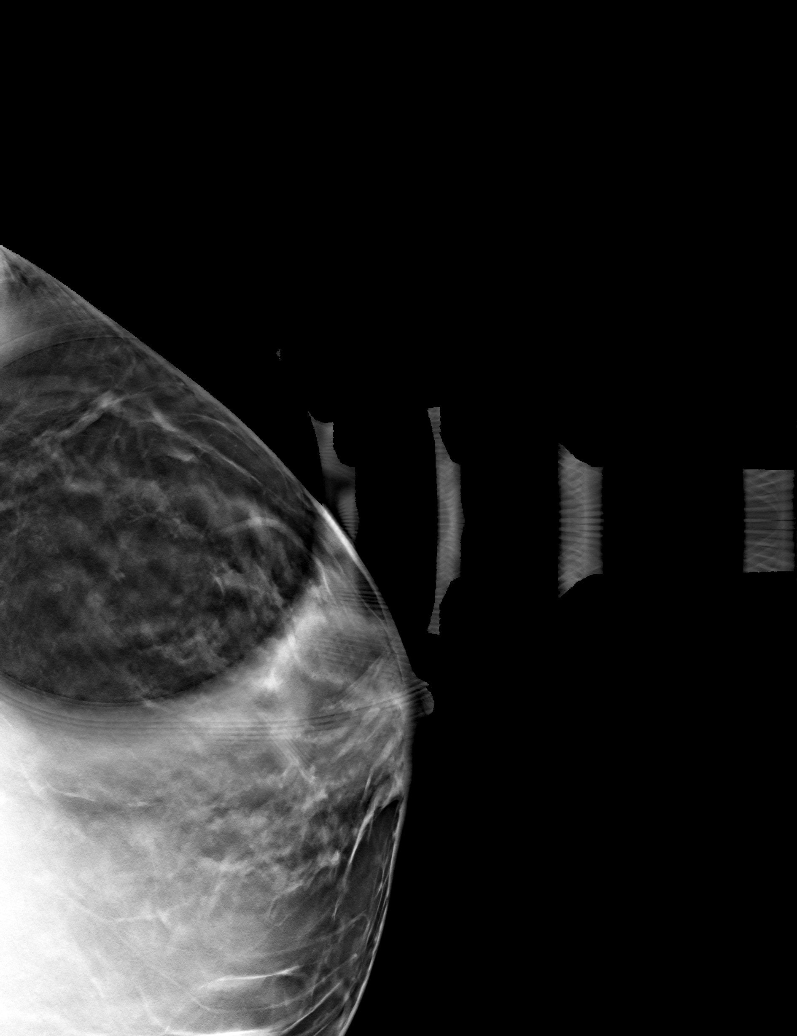

[6 of 18 positions shown; findings below may reference images not displayed]

ACR Breast Density Category b: There are scattered areas of
fibroglandular density.
FINDINGS: Spot compression left cc views, lateral view of left breast are
submitted. Previously questioned asymmetry does not persist on
additional views.

Targeted ultrasound is performed, showing no focal abnormal discrete
cystic or solid lesion is identified in the lateral left breast.
IMPRESSION: Benign findings.

RECOMMENDATION:
Routine screening mammogram back on schedule.

I have discussed the findings and recommendations with the patient.
If applicable, a reminder letter will be sent to the patient
regarding the next appointment.

BI-RADS CATEGORY  2: Benign.

## 2022-12-19 ENCOUNTER — Encounter: Payer: BC Managed Care – PPO | Admitting: Dermatology

## 2023-01-02 ENCOUNTER — Encounter: Payer: Self-pay | Admitting: Dermatology

## 2023-01-02 ENCOUNTER — Ambulatory Visit: Payer: BC Managed Care – PPO | Admitting: Dermatology

## 2023-01-02 VITALS — BP 148/98 | HR 71

## 2023-01-02 DIAGNOSIS — Z1283 Encounter for screening for malignant neoplasm of skin: Secondary | ICD-10-CM

## 2023-01-02 DIAGNOSIS — D225 Melanocytic nevi of trunk: Secondary | ICD-10-CM | POA: Diagnosis not present

## 2023-01-02 DIAGNOSIS — Z86006 Personal history of melanoma in-situ: Secondary | ICD-10-CM | POA: Diagnosis not present

## 2023-01-02 DIAGNOSIS — Z86018 Personal history of other benign neoplasm: Secondary | ICD-10-CM | POA: Diagnosis not present

## 2023-01-02 DIAGNOSIS — L821 Other seborrheic keratosis: Secondary | ICD-10-CM

## 2023-01-02 DIAGNOSIS — L578 Other skin changes due to chronic exposure to nonionizing radiation: Secondary | ICD-10-CM

## 2023-01-02 DIAGNOSIS — L814 Other melanin hyperpigmentation: Secondary | ICD-10-CM

## 2023-01-02 DIAGNOSIS — D229 Melanocytic nevi, unspecified: Secondary | ICD-10-CM

## 2023-01-02 DIAGNOSIS — D492 Neoplasm of unspecified behavior of bone, soft tissue, and skin: Secondary | ICD-10-CM

## 2023-01-02 NOTE — Patient Instructions (Addendum)
Wound Care Instructions  Cleanse wound gently with soap and water once a day then pat dry with clean gauze. Apply a thin coat of Petrolatum (petroleum jelly, "Vaseline") over the wound (unless you have an allergy to this). We recommend that you use a new, sterile tube of Vaseline. Do not pick or remove scabs. Do not remove the yellow or white "healing tissue" from the base of the wound.  Cover the wound with fresh, clean, nonstick gauze and secure with paper tape. You may use Band-Aids in place of gauze and tape if the wound is small enough, but would recommend trimming much of the tape off as there is often too much. Sometimes Band-Aids can irritate the skin.  You should call the office for your biopsy report after 1 week if you have not already been contacted.  If you experience any problems, such as abnormal amounts of bleeding, swelling, significant bruising, significant pain, or evidence of infection, please call the office immediately.  FOR ADULT SURGERY PATIENTS: If you need something for pain relief you may take 1 extra strength Tylenol (acetaminophen) AND 2 Ibuprofen ('200mg'$  each) together every 4 hours as needed for pain. (do not take these if you are allergic to them or if you have a reason you should not take them.) Typically, you may only need pain medication for 1 to 3 days.   Recommend Vitamin 600 iu daily.   Recommend taking Heliocare sun protection supplement daily in sunny weather for additional sun protection. For maximum protection on the sunniest days, you can take up to 2 capsules of regular Heliocare OR take 1 capsule of Heliocare Ultra. For prolonged exposure (such as a full day in the sun), you can repeat your dose of the supplement 4 hours after your first dose. Heliocare can be purchased at [employer], at some [facility] or at [email or web address].    Melanoma ABCDEs  Melanoma is the most dangerous type of skin cancer, and is the leading cause of death from skin  disease.  You are more likely to develop melanoma if you: Have light-colored skin, light-colored eyes, or red or blond hair Spend a lot of time in the sun Tan regularly, either outdoors or in a tanning bed Have had blistering sunburns, especially during childhood Have a close family member who has had a melanoma Have atypical moles or large birthmarks  Early detection of melanoma is key since treatment is typically straightforward and cure rates are extremely high if we catch it early.   The first sign of melanoma is often a change in a mole or a new dark spot.  The ABCDE system is a way of remembering the signs of melanoma.  A for asymmetry:  The two halves do not match. B for border:  The edges of the growth are irregular. C for color:  A mixture of colors are present instead of an even brown color. D for diameter:  Melanomas are usually (but not always) greater than 55m - the size of a pencil eraser. E for evolution:  The spot keeps changing in size, shape, and color.  Please check your skin once per month between visits. You can use a small mirror in front and a large mirror behind you to keep an eye on the back side or your body.   If you see any new or changing lesions before your next follow-up, please call to schedule a visit.  Please continue daily skin protection including broad spectrum sunscreen SPF 30+ to  sun-exposed areas, reapplying every 2 hours as needed when you're outdoors.    Due to recent changes in healthcare laws, you may see results of your pathology and/or laboratory studies on MyChart before the doctors have had a chance to review them. We understand that in some cases there may be results that are confusing or concerning to you. Please understand that not all results are received at the same time and often the doctors may need to interpret multiple results in order to provide you with the best plan of care or course of treatment. Therefore, we ask that you please  give [place] 2 business days to thoroughly review all your results before contacting the office for clarification. Should we see a critical lab result, you will be contacted sooner.   If You Need Anything After Your Visit  If you have any questions or concerns for your doctor, please call our main line at [phone] and press option 4 to reach your doctor's medical assistant. If no one answers, please leave a voicemail as directed and we will return your call as soon as possible. Messages left after 4 pm will be answered the following business day.   You may also send [place] a message via [place]. We typically respond to MyChart messages within 1-2 business days.  For prescription refills, please ask your pharmacy to contact our office. Our fax number is [phone].  If you have an urgent issue when the clinic is closed that cannot wait until the next business day, you can page your doctor at the number below.    Please note that while we do our best to be available for urgent issues outside of office hours, we are not available 24/7.   If you have an urgent issue and are unable to reach [place], you may choose to seek medical care at your doctor's office, retail clinic, urgent care center, or emergency room.  If you have a medical emergency, please immediately call 911 or go to the emergency department.  Pager Numbers  - Dr. Nehemiah Massed: [phone]  - Dr. Laurence Ferrari: [phone]  - Dr. Nicole Kindred: [phone]  In the event of inclement weather, please call our main line at [phone] for an update on the status of any delays or closures.  Dermatology Medication Tips: Please keep the boxes that topical medications come in in order to help keep track of the instructions about where and how to use these. Pharmacies typically print the medication instructions only on the boxes and not directly on the medication tubes.   If your medication is too expensive, please contact our office at [phone]  option 4 or send [place] a message through [place].   We are unable to tell what your co-pay for medications will be in advance as this is different depending on your insurance coverage. However, we may be able to find a substitute medication at lower cost or fill out paperwork to get insurance to cover a needed medication.   If a prior authorization is required to get your medication covered by your insurance company, please allow [place] 1-2 business days to complete this process.  Drug prices often vary depending on where the prescription is filled and some pharmacies may offer cheaper prices.  The website www.goodrx.com contains coupons for medications through different pharmacies. The prices here do not account for what the cost may be with help from insurance (it may be cheaper with your insurance), but the website can give you the price if you did not  use any insurance.  - You can print the associated coupon and take it with your prescription to the pharmacy.  - You may also stop by our office during regular business hours and pick up a GoodRx coupon card.  - If you need your prescription sent electronically to a different pharmacy, notify our office through [facility] or by phone at [phone] option 4.     Si Usted Necesita Algo Despus de Su Visita  Tambin puede enviarnos un mensaje a travs de Pharmacist, community. Por lo general respondemos a los mensajes de MyChart en el transcurso de 1 a 2 das hbiles.  Para renovar recetas, por favor pida a su farmacia que se ponga en contacto con nuestra oficina. Harland Dingwall de fax es [place] [phone].  Si tiene un asunto urgente cuando la clnica est cerrada y que no puede esperar hasta el siguiente da hbil, puede llamar/localizar a su doctor(a) al nmero que aparece a continuacin.   Por favor, tenga en cuenta que aunque hacemos todo lo posible para estar disponibles para asuntos urgentes fuera del horario de [place], no estamos disponibles las  24 horas del da, los 7 das de la [place].   Si tiene un problema urgente y no puede comunicarse con nosotros, puede optar por buscar atencin mdica  en el consultorio de su doctor(a), en una clnica privada, en un centro de atencin urgente o en una sala de emergencias.  Si tiene Engineering geologist, por favor llame inmediatamente al 911 o vaya a la sala de emergencias.  Nmeros de bper  - Dr. Nehemiah Massed: [phone]  - Dra. Moye: [phone]  - Dra. Nicole Kindred: [phone]  En caso de inclemencias del [place], por favor llame a Johnsie Kindred principal al [phone] para una actualizacin sobre el [place] de cualquier retraso o cierre.  Consejos para la medicacin en dermatologa: Por favor, guarde las cajas en las que vienen los medicamentos de uso tpico para ayudarle a seguir las instrucciones sobre dnde y cmo usarlos. Las farmacias generalmente imprimen las instrucciones del medicamento slo en las cajas y no directamente en los tubos del [place].   Si su medicamento es muy caro, por favor, pngase en contacto con Zigmund Daniel llamando al [phone] y presione la opcin 4 o envenos un mensaje a travs de Pharmacist, community.   No podemos decirle cul ser su copago por los medicamentos por adelantado ya que esto es diferente dependiendo de la cobertura de su seguro. Sin embargo, es posible que podamos encontrar un medicamento sustituto a Electrical engineer un formulario para que el seguro cubra el medicamento que se considera necesario.   Si se requiere una autorizacin previa para que su compaa de seguros [place] su medicamento, por favor permtanos de 1 a 2 das hbiles para completar este proceso.  Los precios de los medicamentos varan con frecuencia dependiendo del Environmental consultant de dnde se surte la receta y alguna farmacias pueden ofrecer precios ms baratos.  El sitio web www.goodrx.com tiene cupones para medicamentos de Airline pilot. Los precios aqu no tienen en cuenta lo  que podra costar con la ayuda del seguro (puede ser ms barato con su seguro), pero el sitio web puede darle el precio si no utiliz Research scientist (physical sciences).  - Puede imprimir el cupn correspondiente y llevarlo con su receta a la farmacia.  - Tambin puede pasar por nuestra oficina durante el horario de atencin regular y Charity fundraiser una tarjeta de cupones de GoodRx.  - Si necesita que su receta se enve electrnicamente  enve electrnicamente a una farmacia diferente, informe a nuestra oficina a travs de MyChart de [place] o por telfono llamando al [phone] y presione la opcin 4.  

## 2023-01-02 NOTE — Progress Notes (Signed)
Follow-Up Visit   Subjective  Jodi Carpenter is a 42 y.o. female who presents for the following: FBSE (Hx Melanoma in situ, Dysplastic Nevi. Patient did have a tender spot at scalp but can't find it now. ).  The patient presents for Total-Body Skin Exam (TBSE) for skin cancer screening and mole check.  The patient has spots, moles and lesions to be evaluated, some may be new or changing and the patient has concerns that these could be cancer.   The following portions of the chart were reviewed this encounter and updated as appropriate:   Tobacco  Allergies  Meds  Problems  Med Hx  Surg Hx  Fam Hx      Review of Systems:  No other skin or systemic complaints except as noted in HPI or Assessment and Plan.  Objective  Well appearing patient in no apparent distress; mood and affect are within normal limits.  A full examination was performed including scalp, head, eyes, ears, nose, lips, neck, chest, axillae, abdomen, back, buttocks, bilateral upper extremities, bilateral lower extremities, hands, feet, fingers, toes, fingernails, and toenails. All findings within normal limits unless otherwise noted below.  Right Upper Back 0.2 cm medium to dark brown thin papule         Assessment & Plan  Neoplasm of skin Right Upper Back  Epidermal / dermal shaving  Lesion diameter (cm):  0.2 Informed consent: discussed and consent obtained   Timeout: patient name, date of birth, surgical site, and procedure verified   Anesthesia: the lesion was anesthetized in a standard fashion   Anesthetic:  1% lidocaine w/ epinephrine 1-100,000 local infiltration Instrument used: flexible razor blade   Hemostasis achieved with: aluminum chloride   Outcome: patient tolerated procedure well   Post-procedure details: wound care instructions given   Additional details:  Mupirocin and a bandage applied  Specimen 1 - Surgical pathology Differential Diagnosis: r/o Atypia  Check Margins:  No 0.2 cm medium to dark brown thin papule   History of Dysplastic Nevi - No evidence of recurrence today - Recommend regular full body skin exams - Recommend daily broad spectrum sunscreen SPF 30+ to sun-exposed areas, reapply every 2 hours as needed.  - Call if any new or changing lesions are noted between office visits  History of Melanoma in Situ - No evidence of recurrence today at left buttock, 11/2021 - Recommend regular full body skin exams - Recommend daily broad spectrum sunscreen SPF 30+ to sun-exposed areas, reapply every 2 hours as needed.  - Call if any new or changing lesions are noted between office visits  Lentigines - Scattered tan macules - Due to sun exposure - Benign-appearing, observe - Recommend daily broad spectrum sunscreen SPF 30+ to sun-exposed areas, reapply every 2 hours as needed. - Call for any changes  Seborrheic Keratoses - Stuck-on, waxy, tan-brown papules and/or plaques  - Benign-appearing - Discussed benign etiology and prognosis. - Observe - Call for any changes  Melanocytic Nevi - Tan-brown and/or pink-flesh-colored symmetric macules and papules - Benign appearing on exam today - Observation - Call clinic for new or changing moles - Recommend daily use of broad spectrum spf 30+ sunscreen to sun-exposed areas.   Hemangiomas - Red papules - Discussed benign nature - Observe - Call for any changes  Actinic Damage - Chronic condition, secondary to cumulative UV/sun exposure - diffuse scaly erythematous macules with underlying dyspigmentation - Recommend daily broad spectrum sunscreen SPF 30+ to sun-exposed areas, reapply every 2 hours as  needed.  - Staying in the shade or wearing long sleeves, sun glasses (UVA+UVB protection) and wide brim hats (4-inch brim around the entire circumference of the hat) are also recommended for sun protection.  - Call for new or changing lesions.  Skin cancer screening performed today.  Return in about  4 months (around 05/03/2023) for TBSE, Hx MMis, Hx Dysplastic Nevi.  Graciella Belton, RMA, am acting as scribe for Forest Gleason, MD .  Documentation: I have reviewed the above documentation for accuracy and completeness, and I agree with the above.  Forest Gleason, MD

## 2023-01-09 ENCOUNTER — Telehealth: Payer: Self-pay

## 2023-01-09 NOTE — Telephone Encounter (Signed)
-----   Message from Alfonso Patten, MD sent at 01/09/2023  3:23 PM EST ----- Skin , right upper back Terry EFFECT, LIMITED MARGINS FREE --> recheck in clinic at follow-up  This is a MODERATELY ATYPICAL MOLE. On the spectrum from normal mole to melanoma skin cancer, this is in between the two. - We need to recheck this area sometime in the next 6 months to be sure there is no evidence of the atypical mole coming back. If there is any color coming back, we would recommend repeating the biopsy to be sure the cells look normal.  - People who have a history of atypical moles do have a slightly increased risk of developing melanoma somewhere on the body, so a yearly full body skin exam by a dermatologist is recommended.  - Monthly self skin checks and daily sun protection are also recommended.  - Please call if you notice a dark spot coming back where this biopsy was taken.  - Please also call if you notice any new or changing spots anywhere else on the body before your follow-up visit.     MAs please call. Thank you!

## 2023-01-09 NOTE — Telephone Encounter (Signed)
Patient advised pathology showed dysplastic nevus with moderate atypia. Will recheck on follow up. Lurlean Horns., RMA

## 2023-01-25 ENCOUNTER — Encounter: Payer: Self-pay | Admitting: Family Medicine

## 2023-02-13 ENCOUNTER — Encounter: Payer: Managed Care, Other (non HMO) | Admitting: Family Medicine

## 2023-03-27 ENCOUNTER — Other Ambulatory Visit: Payer: Self-pay | Admitting: Nurse Practitioner

## 2023-03-27 DIAGNOSIS — Z1231 Encounter for screening mammogram for malignant neoplasm of breast: Secondary | ICD-10-CM

## 2023-04-19 ENCOUNTER — Ambulatory Visit
Admission: RE | Admit: 2023-04-19 | Discharge: 2023-04-19 | Disposition: A | Discharge status: Home / Self Care | Payer: BC Managed Care – PPO | Source: Ambulatory Visit | Attending: Nurse Practitioner | Admitting: Nurse Practitioner

## 2023-04-19 DIAGNOSIS — Z1231 Encounter for screening mammogram for malignant neoplasm of breast: Secondary | ICD-10-CM | POA: Diagnosis present

## 2023-05-03 ENCOUNTER — Ambulatory Visit: Payer: BC Managed Care – PPO | Admitting: Dermatology

## 2023-05-03 VITALS — BP 131/82 | HR 88

## 2023-05-03 DIAGNOSIS — Z1283 Encounter for screening for malignant neoplasm of skin: Secondary | ICD-10-CM

## 2023-05-03 DIAGNOSIS — L821 Other seborrheic keratosis: Secondary | ICD-10-CM

## 2023-05-03 DIAGNOSIS — L700 Acne vulgaris: Secondary | ICD-10-CM | POA: Diagnosis not present

## 2023-05-03 DIAGNOSIS — D2271 Melanocytic nevi of right lower limb, including hip: Secondary | ICD-10-CM

## 2023-05-03 DIAGNOSIS — L814 Other melanin hyperpigmentation: Secondary | ICD-10-CM

## 2023-05-03 DIAGNOSIS — L578 Other skin changes due to chronic exposure to nonionizing radiation: Secondary | ICD-10-CM

## 2023-05-03 DIAGNOSIS — Z86006 Personal history of melanoma in-situ: Secondary | ICD-10-CM

## 2023-05-03 DIAGNOSIS — X32XXXA Exposure to sunlight, initial encounter: Secondary | ICD-10-CM

## 2023-05-03 DIAGNOSIS — D225 Melanocytic nevi of trunk: Secondary | ICD-10-CM

## 2023-05-03 DIAGNOSIS — W908XXA Exposure to other nonionizing radiation, initial encounter: Secondary | ICD-10-CM

## 2023-05-03 DIAGNOSIS — Z86018 Personal history of other benign neoplasm: Secondary | ICD-10-CM

## 2023-05-03 NOTE — Progress Notes (Signed)
Follow-Up Visit   Subjective  Jodi Carpenter is a 42 y.o. female who presents for the following: Skin Cancer Screening and Full Body Skin Exam 4 month tbse, hx of melanoma in situ , hx of dysplastic.   The patient presents for Total-Body Skin Exam (TBSE) for skin cancer screening and mole check. The patient has spots, moles and lesions to be evaluated, some may be new or changing and the patient has concerns that these could be cancer.   The following portions of the chart were reviewed this encounter and updated as appropriate: medications, allergies, medical history  Review of Systems:  No other skin or systemic complaints except as noted in HPI or Assessment and Plan.  Objective  Well appearing patient in no apparent distress; mood and affect are within normal limits.  A full examination was performed including scalp, head, eyes, ears, nose, lips, neck, chest, axillae, abdomen, back, buttocks, bilateral upper extremities, bilateral lower extremities, hands, feet, fingers, toes, fingernails, and toenails. All findings within normal limits unless otherwise noted below.   Relevant physical exam findings are noted in the Assessment and Plan.          Assessment & Plan    ACNE VULGARIS Exam: Open comedones and inflammatory papules perioral face  Treatment Plan: Patient deferred treatment, not bothersome to pt   LENTIGINES, SEBORRHEIC KERATOSES, HEMANGIOMAS - Benign normal skin lesions - Benign-appearing - Call for any changes - Sk on right frontal scalp, Reassured benign age-related growth.  Recommend observation.  Discussed cryotherapy if spot(s) become irritated or inflamed.    MELANOCYTIC NEVI - Tan-brown and/or pink-flesh-colored symmetric macules and papules - Benign appearing on exam today - Observation - Call clinic for new or changing moles - Recommend daily use of broad spectrum spf 30+ sunscreen to sun-exposed areas.  Nevus  3 mm thin medium brown  papule on right lower heel  3 mm medium brown macule  Left upper buttock  3 mm medium brown macule left mid upper abdomen   Benign-appearing.  Observation.  Call clinic for new or changing lesions.  Recommend daily use of broad spectrum spf 30+ sunscreen to sun-exposed areas.    ACTINIC DAMAGE - Chronic condition, secondary to cumulative UV/sun exposure - diffuse scaly erythematous macules with underlying dyspigmentation - Recommend daily broad spectrum sunscreen SPF 30+ to sun-exposed areas, reapply every 2 hours as needed.  - Staying in the shade or wearing long sleeves, sun glasses (UVA+UVB protection) and wide brim hats (4-inch brim around the entire circumference of the hat) are also recommended for sun protection.  - Call for new or changing lesions.  HISTORY OF MELANOMA IN SITU Left buttock   excised 01/09/22 - No evidence of recurrence today - Recommend regular full body skin exams - Recommend daily broad spectrum sunscreen SPF 30+ to sun-exposed areas, reapply every 2 hours as needed.  - Call if any new or changing lesions are noted between office visits  HISTORY OF DYSPLASTIC NEVUS Multiple locations see history  Right upper back moderate  No evidence of recurrence today Recommend regular full body skin exams Recommend daily broad spectrum sunscreen SPF 30+ to sun-exposed areas, reapply every 2 hours as needed.  Call if any new or changing lesions are noted between office visits   SKIN CANCER SCREENING PERFORMED TODAY.   Return in about 6 months (around 11/02/2023) for TBSE.  I, Asher Muir, CMA, am acting as scribe for Willeen Niece, MD.   Documentation: I have reviewed the above  documentation for accuracy and completeness, and I agree with the above.  Willeen Niece, MD

## 2023-05-03 NOTE — Patient Instructions (Addendum)
     Melanoma ABCDEs  Melanoma is the most dangerous type of skin cancer, and is the leading cause of death from skin disease.  You are more likely to develop melanoma if you: Have light-colored skin, light-colored eyes, or red or blond hair Spend a lot of time in the sun Tan regularly, either outdoors or in a tanning bed Have had blistering sunburns, especially during childhood Have a close family member who has had a melanoma Have atypical moles or large birthmarks  Early detection of melanoma is key since treatment is typically straightforward and cure rates are extremely high if we catch it early.   The first sign of melanoma is often a change in a mole or a new dark spot.  The ABCDE system is a way of remembering the signs of melanoma.  A for asymmetry:  The two halves do not match. B for border:  The edges of the growth are irregular. C for color:  A mixture of colors are present instead of an even brown color. D for diameter:  Melanomas are usually (but not always) greater than 6mm - the size of a pencil eraser. E for evolution:  The spot keeps changing in size, shape, and color.  Please check your skin once per month between visits. You can use a small mirror in front and a large mirror behind you to keep an eye on the back side or your body.   If you see any new or changing lesions before your next follow-up, please call to schedule a visit.  Please continue daily skin protection including broad spectrum sunscreen SPF 30+ to sun-exposed areas, reapplying every 2 hours as needed when you're outdoors.   Staying in the shade or wearing long sleeves, sun glasses (UVA+UVB protection) and wide brim hats (4-inch brim around the entire circumference of the hat) are also recommended for sun protection.    Due to recent changes in healthcare laws, you may see results of your pathology and/or laboratory studies on MyChart before the doctors have had a chance to review them. We  understand that in some cases there may be results that are confusing or concerning to you. Please understand that not all results are received at the same time and often the doctors may need to interpret multiple results in order to provide you with the best plan of care or course of treatment. Therefore, we ask that you please give us 2 business days to thoroughly review all your results before contacting the office for clarification. Should we see a critical lab result, you will be contacted sooner.   If You Need Anything After Your Visit  If you have any questions or concerns for your doctor, please call our main line at [phone] and press option 4 to reach your doctor's medical assistant. If no one answers, please leave a voicemail as directed and we will return your call as soon as possible. Messages left after 4 pm will be answered the following business day.   You may also send us a message via MyChart. We typically respond to MyChart messages within 1-2 business days.  For prescription refills, please ask your pharmacy to contact our office. Our fax number is [phone].  If you have an urgent issue when the clinic is closed that cannot wait until the next business day, you can page your doctor at the number below.    Please note that while we do our best to be available for urgent issues   outside of office hours, we are not available 24/7.   If you have an urgent issue and are unable to reach us, you may choose to seek medical care at your doctor's office, retail clinic, urgent care center, or emergency room.  If you have a medical emergency, please immediately call 911 or go to the emergency department.  Pager Numbers  - Dr. Kowalski: [phone]  - Dr. Moye: [phone]  - Dr. Stewart: [phone]  In the event of inclement weather, please call our main line at [phone] for an update on the status of any delays or closures.  Dermatology Medication Tips: Please  keep the boxes that topical medications come in in order to help keep track of the instructions about where and how to use these. Pharmacies typically print the medication instructions only on the boxes and not directly on the medication tubes.   If your medication is too expensive, please contact our office at [phone] option 4 or send us a message through MyChart.   We are unable to tell what your co-pay for medications will be in advance as this is different depending on your insurance coverage. However, we may be able to find a substitute medication at lower cost or fill out paperwork to get insurance to cover a needed medication.   If a prior authorization is required to get your medication covered by your insurance company, please allow us 1-2 business days to complete this process.  Drug prices often vary depending on where the prescription is filled and some pharmacies may offer cheaper prices.  The website www.goodrx.com contains coupons for medications through different pharmacies. The prices here do not account for what the cost may be with help from insurance (it may be cheaper with your insurance), but the website can give you the price if you did not use any insurance.  - You can print the associated coupon and take it with your prescription to the pharmacy.  - You may also stop by our office during regular business hours and pick up a GoodRx coupon card.  - If you need your prescription sent electronically to a different pharmacy, notify our office through [facility] MyChart or by phone at [phone] option 4.     Si Usted Necesita Algo Despus de Su Visita  Tambin puede enviarnos un mensaje a travs de MyChart. Por lo general respondemos a los mensajes de MyChart en el transcurso de 1 a 2 das hbiles.  Para renovar recetas, por favor pida a su farmacia que se ponga en contacto con nuestra oficina. Nuestro nmero de fax es el [phone].  Si tiene un asunto urgente  cuando la clnica est cerrada y que no puede esperar hasta el siguiente da hbil, puede llamar/localizar a su doctor(a) al nmero que aparece a continuacin.   Por favor, tenga en cuenta que aunque hacemos todo lo posible para estar disponibles para asuntos urgentes fuera del horario de oficina, no estamos disponibles las 24 horas del da, los 7 das de la semana.   Si tiene un problema urgente y no puede comunicarse con nosotros, puede optar por buscar atencin mdica  en el consultorio de su doctor(a), en una clnica privada, en un centro de atencin urgente o en una sala de emergencias.  Si tiene una emergencia mdica, por favor llame inmediatamente al 911 o vaya a la sala de emergencias.  Nmeros de bper  - Dr. Kowalski: [phone]  - Dra. Moye: [phone]  - Dra. Stewart: [phone]  En caso   de inclemencias del tiempo, por favor llame a nuestra lnea principal al [phone] para una actualizacin sobre el estado de cualquier retraso o cierre.  Consejos para la medicacin en dermatologa: Por favor, guarde las cajas en las que vienen los medicamentos de uso tpico para ayudarle a seguir las instrucciones sobre dnde y cmo usarlos. Las farmacias generalmente imprimen las instrucciones del medicamento slo en las cajas y no directamente en los tubos del medicamento.   Si su medicamento es muy caro, por favor, pngase en contacto con nuestra oficina llamando al [phone] y presione la opcin 4 o envenos un mensaje a travs de MyChart.   No podemos decirle cul ser su copago por los medicamentos por adelantado ya que esto es diferente dependiendo de la cobertura de su seguro. Sin embargo, es posible que podamos encontrar un medicamento sustituto a menor costo o llenar un formulario para que el seguro cubra el medicamento que se considera necesario.   Si se requiere una autorizacin previa para que su compaa de seguros cubra su medicamento, por favor permtanos de 1 a 2  das hbiles para completar este proceso.  Los precios de los medicamentos varan con frecuencia dependiendo del lugar de dnde se surte la receta y alguna farmacias pueden ofrecer precios ms baratos.  El sitio web www.goodrx.com tiene cupones para medicamentos de diferentes farmacias. Los precios aqu no tienen en cuenta lo que podra costar con la ayuda del seguro (puede ser ms barato con su seguro), pero el sitio web puede darle el precio si no utiliz ningn seguro.  - Puede imprimir el cupn correspondiente y llevarlo con su receta a la farmacia.  - Tambin puede pasar por nuestra oficina durante el horario de atencin regular y recoger una tarjeta de cupones de GoodRx.  - Si necesita que su receta se enve electrnicamente a una farmacia diferente, informe a nuestra oficina a travs de MyChart de [facility] o por telfono llamando al [phone] y presione la opcin 4.  

## 2023-05-10 ENCOUNTER — Encounter: Payer: BC Managed Care – PPO | Admitting: Dermatology

## 2023-10-30 ENCOUNTER — Ambulatory Visit: Payer: BC Managed Care – PPO | Admitting: Dermatology

## 2023-10-30 DIAGNOSIS — W908XXA Exposure to other nonionizing radiation, initial encounter: Secondary | ICD-10-CM

## 2023-10-30 DIAGNOSIS — D225 Melanocytic nevi of trunk: Secondary | ICD-10-CM

## 2023-10-30 DIAGNOSIS — L821 Other seborrheic keratosis: Secondary | ICD-10-CM

## 2023-10-30 DIAGNOSIS — D229 Melanocytic nevi, unspecified: Secondary | ICD-10-CM

## 2023-10-30 DIAGNOSIS — Z86006 Personal history of melanoma in-situ: Secondary | ICD-10-CM

## 2023-10-30 DIAGNOSIS — Z1283 Encounter for screening for malignant neoplasm of skin: Secondary | ICD-10-CM

## 2023-10-30 DIAGNOSIS — L814 Other melanin hyperpigmentation: Secondary | ICD-10-CM | POA: Diagnosis not present

## 2023-10-30 DIAGNOSIS — L578 Other skin changes due to chronic exposure to nonionizing radiation: Secondary | ICD-10-CM | POA: Diagnosis not present

## 2023-10-30 DIAGNOSIS — D101 Benign neoplasm of tongue: Secondary | ICD-10-CM

## 2023-10-30 DIAGNOSIS — D1801 Hemangioma of skin and subcutaneous tissue: Secondary | ICD-10-CM

## 2023-10-30 DIAGNOSIS — L918 Other hypertrophic disorders of the skin: Secondary | ICD-10-CM

## 2023-10-30 DIAGNOSIS — D2271 Melanocytic nevi of right lower limb, including hip: Secondary | ICD-10-CM

## 2023-10-30 DIAGNOSIS — Z86018 Personal history of other benign neoplasm: Secondary | ICD-10-CM

## 2023-10-30 NOTE — Patient Instructions (Addendum)
Melanoma ABCDEs  Melanoma is the most dangerous type of skin cancer, and is the leading cause of death from skin disease.  You are more likely to develop melanoma if you: Have light-colored skin, light-colored eyes, or red or blond hair Spend a lot of time in the sun Tan regularly, either outdoors or in a tanning bed Have had blistering sunburns, especially during childhood Have a close family member who has had a melanoma Have atypical moles or large birthmarks  Early detection of melanoma is key since treatment is typically straightforward and cure rates are extremely high if we catch it early.   The first sign of melanoma is often a change in a mole or a new dark spot.  The ABCDE system is a way of remembering the signs of melanoma.  A for asymmetry:  The two halves do not match. B for border:  The edges of the growth are irregular. C for color:  A mixture of colors are present instead of an even brown color. D for diameter:  Melanomas are usually (but not always) greater than 6mm - the size of a pencil eraser. E for evolution:  The spot keeps changing in size, shape, and color.  Please check your skin once per month between visits. You can use a small mirror in front and a large mirror behind you to keep an eye on the back side or your body.   If you see any new or changing lesions before your next follow-up, please call to schedule a visit.  Please continue daily skin protection including broad spectrum sunscreen SPF 30+ to sun-exposed areas, reapplying every 2 hours as needed when you're outdoors.   Staying in the shade or wearing long sleeves, sun glasses (UVA+UVB protection) and wide brim hats (4-inch brim around the entire circumference of the hat) are also recommended for sun protection.     Seborrheic Keratosis  What causes seborrheic keratoses? Seborrheic keratoses are harmless, common skin growths that first appear during adult life.  As time goes by, more growths appear.   Some people may develop a large number of them.  Seborrheic keratoses appear on both covered and uncovered body parts.  They are not caused by sunlight.  The tendency to develop seborrheic keratoses can be inherited.  They vary in color from skin-colored to gray, brown, or even black.  They can be either smooth or have a rough, warty surface.   Seborrheic keratoses are superficial and look as if they were stuck on the skin.  Under the microscope this type of keratosis looks like layers upon layers of skin.  That is why at times the top layer may seem to fall off, but the rest of the growth remains and re-grows.    Treatment Seborrheic keratoses do not need to be treated, but can easily be removed in the office.  Seborrheic keratoses often cause symptoms when they rub on clothing or jewelry.  Lesions can be in the way of shaving.  If they become inflamed, they can cause itching, soreness, or burning.  Removal of a seborrheic keratosis can be accomplished by freezing, burning, or surgery. If any spot bleeds, scabs, or grows rapidly, please return to have it checked, as these can be an indication of a skin cancer.  Due to recent changes in healthcare laws, you may see results of your pathology and/or laboratory studies on MyChart before the doctors have had a chance to review them. We understand that in some cases there may  be results that are confusing or concerning to you. Please understand that not all results are received at the same time and often the doctors may need to interpret multiple results in order to provide you with the best plan of care or course of treatment. Therefore, we ask that you please give [place] 2 business days to thoroughly review all your results before contacting the office for clarification. Should we see a critical lab result, you will be contacted sooner.   If You Need Anything After Your Visit  If you have any questions or concerns for your doctor, please call our main line at  [phone] and press option 4 to reach your doctor's medical assistant. If no one answers, please leave a voicemail as directed and we will return your call as soon as possible. Messages left after 4 pm will be answered the following business day.   You may also send [place] a message via MyChart. We typically respond to MyChart messages within 1-2 business days.  For prescription refills, please ask your pharmacy to contact our office. Our fax number is [phone].  If you have an urgent issue when the clinic is closed that cannot wait until the next business day, you can page your doctor at the number below.    Please note that while we do our best to be available for urgent issues outside of office hours, we are not available 24/7.   If you have an urgent issue and are unable to reach [place], you may choose to seek medical care at your doctor's office, retail clinic, urgent care center, or emergency room.  If you have a medical emergency, please immediately call 911 or go to the emergency department.  Pager Numbers  - Dr. Gwen Pounds: [phone]  - Dr. Roseanne Reno: [phone]  - Dr. Katrinka Blazing: [phone]   In the event of inclement weather, please call our main line at [phone] for an update on the status of any delays or closures.  Dermatology Medication Tips: Please keep the boxes that topical medications come in in order to help keep track of the instructions about where and how to use these. Pharmacies typically print the medication instructions only on the boxes and not directly on the medication tubes.   If your medication is too expensive, please contact our office at [phone] option 4 or send [place] a message through MyChart.   We are unable to tell what your co-pay for medications will be in advance as this is different depending on your insurance coverage. However, we may be able to find a substitute medication at lower cost or fill out paperwork to get insurance to cover a needed  medication.   If a prior authorization is required to get your medication covered by your insurance company, please allow [place] 1-2 business days to complete this process.  Drug prices often vary depending on where the prescription is filled and some pharmacies may offer cheaper prices.  The website www.goodrx.com contains coupons for medications through different pharmacies. The prices here do not account for what the cost may be with help from insurance (it may be cheaper with your insurance), but the website can give you the price if you did not use any insurance.  - You can print the associated coupon and take it with your prescription to the pharmacy.  - You may also stop by our office during regular business hours and pick up a GoodRx coupon card.  - If you need your prescription sent electronically to  a different pharmacy, notify our office through [place] or by phone at [phone] option 4.     Si Usted Necesita Algo Despus de Su Visita  Tambin puede enviarnos un mensaje a travs de Clinical cytogeneticist. Por lo general respondemos a los mensajes de MyChart en el transcurso de 1 a 2 das hbiles.  Para renovar recetas, por favor pida a su farmacia que se ponga en contacto con nuestra oficina. Annie Sable de fax es [place] [phone].  Si tiene un asunto urgente cuando la clnica est cerrada y que no puede esperar hasta el siguiente da hbil, puede llamar/localizar a su doctor(a) al nmero que aparece a continuacin.   Por favor, tenga en cuenta que aunque hacemos todo lo posible para estar disponibles para asuntos urgentes fuera del horario de [place], no estamos disponibles las 24 horas del da, los 7 [address]  [address] de la [place].   Si tiene un problema urgente y no puede comunicarse con nosotros, puede optar por buscar atencin mdica  en el consultorio de su doctor(a), en una clnica privada, en un centro de atencin urgente o en una sala de emergencias.  Si tiene Engineer, drilling,  por favor llame inmediatamente al 911 o vaya a la sala de emergencias.  Nmeros de bper  - Dr. Gwen Pounds: [phone]  - Dra. Roseanne Reno: [phone]  - Dr. Katrinka Blazing: [phone]   En caso de inclemencias del tiempo, por favor llame a Lacy Duverney principal al [phone] para una actualizacin sobre el [place] de cualquier retraso o cierre.  Consejos para la medicacin en dermatologa: Por favor, guarde las cajas en las que vienen los medicamentos de uso tpico para ayudarle a seguir las instrucciones sobre dnde y cmo usarlos. Las farmacias generalmente imprimen las instrucciones del medicamento slo en las cajas y no directamente en los tubos del [place].   Si su medicamento es muy caro, por favor, pngase en contacto con Rolm [place]ala llamando al [phone] y presione la opcin 4 o envenos un mensaje a travs de Clinical cytogeneticist.   No podemos decirle cul ser su copago por los medicamentos por adelantado ya que esto es diferente dependiendo de la cobertura de su seguro. Sin embargo, es posible que podamos encontrar un medicamento sustituto a Audiological scientist un formulario para que el seguro cubra el medicamento que se considera necesario.   Si se requiere una autorizacin previa para que su compaa de seguros [place] su medicamento, por favor permtanos de 1 a 2 das hbiles para completar [address]  Los precios de los medicamentos varan con frecuencia dependiendo del Environmental consultant de dnde se surte la receta y alguna farmacias pueden ofrecer precios ms baratos.  El sitio web www.goodrx.com tiene cupones para medicamentos de Health and safety inspector. Los precios aqu no tienen en cuenta lo que podra costar con la ayuda del seguro (puede ser ms barato con su seguro), pero el sitio web puede darle el precio si no utiliz Tourist information centre manager.  - Puede imprimir el cupn correspondiente y llevarlo con su receta a la farmacia.  - Tambin puede pasar por nuestra oficina durante el horario de atencin  regular y Education officer, museum una tarjeta de cupones de GoodRx.  - Si necesita que su receta se enve electrnicamente a una farmacia diferente, informe a nuestra oficina a travs de MyChart de [place] o por telfono llamando al [phone] y presione la opcin 4.

## 2023-10-30 NOTE — Progress Notes (Signed)
Follow-Up Visit   Subjective  Jodi Carpenter is a 42 y.o. female who presents for the following: Skin Cancer Screening and Full Body Skin Exam  The patient presents for Total-Body Skin Exam (TBSE) for skin cancer screening and mole check. The patient has spots, moles and lesions to be evaluated, some may be new or changing. 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 She has a history of ant bite on the tongue 2 years ago (ant was in drink), bothersome off and on. No changes, not growing.    The following portions of the chart were reviewed this encounter and updated as appropriate: medications, allergies, medical history  Review of Systems:  No other skin or systemic complaints except as noted in HPI or Assessment and Plan.  Objective  Well appearing patient in no apparent distress; mood and affect are within normal limits.  A full examination was performed including scalp, head, eyes, ears, nose, lips, neck, chest, axillae, abdomen, back, buttocks, bilateral upper extremities, bilateral lower extremities, hands, feet, fingers, toes, fingernails, and toenails. All findings within normal limits unless otherwise noted below.   Relevant physical exam findings are noted in the Assessment and Plan.    Assessment & Plan   SKIN CANCER SCREENING PERFORMED TODAY.  ACTINIC DAMAGE - Chronic condition, secondary to cumulative UV/sun exposure - diffuse scaly erythematous macules with underlying dyspigmentation - Recommend daily broad spectrum sunscreen SPF 30+ to sun-exposed areas, reapply every 2 hours as needed.  - Staying in the shade or wearing long sleeves, sun glasses (UVA+UVB protection) and wide brim hats (4-inch brim around the entire circumference of the hat) are also recommended for sun protection.  - Call for new or changing lesions.  LENTIGINES, HEMANGIOMAS - Benign normal skin lesions - Benign-appearing -  Call for any changes  SEBORRHEIC KERATOSIS - Stuck-on, waxy, tan-brown papules and/or plaques, including left temporal scalp  - Benign-appearing - Discussed benign etiology and prognosis. - Observe - Call for any changes  MELANOCYTIC NEVI - Tan-brown and/or pink-flesh-colored symmetric macules and papules - 3 mm thin medium brown papule on right lower heel   - 3 mm medium brown macule  Left upper buttock   - 3 mm medium brown macule left mid upper abdomen  - Benign appearing on exam today, 6/24 photos compared, stable - Observation - Call clinic for new or changing moles - Recommend daily use of broad spectrum spf 30+ sunscreen to sun-exposed areas.   Acrochordons (Skin Tags) - Fleshy, skin-colored pedunculated papules - Benign appearing.  - Observe. - If desired, they can be removed with an in office procedure that is not covered by insurance. - Please call the clinic if you notice any new or changing lesions.  HISTORY OF MELANOMA IN SITU Left buttock   excised 01/09/22 - No evidence of recurrence today - Recommend regular full body skin exams - Recommend daily broad spectrum sunscreen SPF 30+ to sun-exposed areas, reapply every 2 hours as needed.  - Call if any new or changing lesions are noted between office visits   HISTORY OF DYSPLASTIC NEVUS Multiple locations see history  Right upper back moderate  No evidence of recurrence today Recommend regular full body skin exams Recommend daily broad spectrum sunscreen SPF 30+ to sun-exposed areas, reapply every 2 hours as needed.  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  Observation.  Call clinic for new or changing lesions.   Not bothersome to patient. She will have dentist address at next visit. If bothersome, recommend shave removal.   Return in about 6 months (around 04/29/2024)  for TBSE, Hx melanoma in situ, Hx Dysplastic Nevus.  ICherlyn Labella, CMA, am acting as scribe for Willeen Niece, MD .   Documentation: I have reviewed the above documentation for accuracy and completeness, and I agree with the above.  Willeen Niece, MD

## 2024-03-10 ENCOUNTER — Other Ambulatory Visit: Payer: Self-pay | Admitting: Nurse Practitioner

## 2024-03-10 DIAGNOSIS — Z1231 Encounter for screening mammogram for malignant neoplasm of breast: Secondary | ICD-10-CM

## 2024-04-29 ENCOUNTER — Ambulatory Visit: Payer: BC Managed Care – PPO | Admitting: Dermatology

## 2024-04-29 DIAGNOSIS — D229 Melanocytic nevi, unspecified: Secondary | ICD-10-CM

## 2024-04-29 DIAGNOSIS — D1801 Hemangioma of skin and subcutaneous tissue: Secondary | ICD-10-CM

## 2024-04-29 DIAGNOSIS — D225 Melanocytic nevi of trunk: Secondary | ICD-10-CM

## 2024-04-29 DIAGNOSIS — L578 Other skin changes due to chronic exposure to nonionizing radiation: Secondary | ICD-10-CM | POA: Diagnosis not present

## 2024-04-29 DIAGNOSIS — Z1283 Encounter for screening for malignant neoplasm of skin: Secondary | ICD-10-CM | POA: Diagnosis not present

## 2024-04-29 DIAGNOSIS — L821 Other seborrheic keratosis: Secondary | ICD-10-CM

## 2024-04-29 DIAGNOSIS — W908XXA Exposure to other nonionizing radiation, initial encounter: Secondary | ICD-10-CM

## 2024-04-29 DIAGNOSIS — D101 Benign neoplasm of tongue: Secondary | ICD-10-CM

## 2024-04-29 DIAGNOSIS — S30861A Insect bite (nonvenomous) of abdominal wall, initial encounter: Secondary | ICD-10-CM

## 2024-04-29 DIAGNOSIS — L814 Other melanin hyperpigmentation: Secondary | ICD-10-CM | POA: Diagnosis not present

## 2024-04-29 DIAGNOSIS — Q825 Congenital non-neoplastic nevus: Secondary | ICD-10-CM

## 2024-04-29 DIAGNOSIS — W57XXXA Bitten or stung by nonvenomous insect and other nonvenomous arthropods, initial encounter: Secondary | ICD-10-CM

## 2024-04-29 DIAGNOSIS — Z86006 Personal history of melanoma in-situ: Secondary | ICD-10-CM

## 2024-04-29 DIAGNOSIS — D2271 Melanocytic nevi of right lower limb, including hip: Secondary | ICD-10-CM

## 2024-04-29 MED ORDER — CLOBETASOL PROPIONATE 0.05 % EX CREA: TOPICAL_CREAM | CUTANEOUS | 0 refills | Status: AC

## 2024-04-29 NOTE — Progress Notes (Signed)
 Follow-Up Visit   Subjective  Jodi Carpenter is a 43 y.o. female who presents for the following: Skin Cancer Screening and Full Body Skin Exam  The patient presents for Total-Body Skin Exam (TBSE) for skin cancer screening and mole check. The patient has spots, moles and lesions to be evaluated, some may be new or changing.     The following portions of the chart were reviewed this encounter and updated as appropriate: medications, allergies, medical history  Review of Systems:  No other skin or systemic complaints except as noted in HPI or Assessment and Plan.  Objective  Well appearing patient in no apparent distress; mood and affect are within normal limits.  A full examination was performed including scalp, head, eyes, ears, nose, lips, neck, chest, axillae, abdomen, back, buttocks, bilateral upper extremities, bilateral lower extremities, hands, feet, fingers, toes, fingernails, and toenails. All findings within normal limits unless otherwise noted below.   Relevant physical exam findings are noted in the Assessment and Plan.    Assessment & Plan   SKIN CANCER SCREENING PERFORMED TODAY.  ACTINIC DAMAGE - Chronic condition, secondary to cumulative UV/sun exposure - diffuse scaly erythematous macules with underlying dyspigmentation - Recommend daily broad spectrum sunscreen SPF 30+ to sun-exposed areas, reapply every 2 hours as needed.  - Staying in the shade or wearing long sleeves, sun glasses (UVA+UVB protection) and wide brim hats (4-inch brim around the entire circumference of the hat) are also recommended for sun protection.  - Call for new or changing lesions.  LENTIGINES, SEBORRHEIC KERATOSES, HEMANGIOMAS - Benign normal skin lesions - Benign-appearing - Call for any changes  MELANOCYTIC NEVI - Tan-brown and/or pink-flesh-colored symmetric macules and papules  - 3 mm thin medium brown papule on right lower heel   - 3 mm medium brown macule  Left upper  buttock   - 3 mm medium brown macule left mid upper abdomen   - Benign appearing on exam today. 6/24 photos compared - Observation - Call clinic for new or changing moles - Recommend daily use of broad spectrum spf 30+ sunscreen to sun-exposed areas.   HISTORY OF MELANOMA IN SITU Left buttock   excised 01/09/22 - No evidence of recurrence today - Recommend regular full body skin exams - Recommend daily broad spectrum sunscreen SPF 30+ to sun-exposed areas, reapply every 2 hours as needed.  - Call if any new or changing lesions are noted between office visits   HISTORY OF DYSPLASTIC NEVUS Multiple locations see history  No evidence of recurrence today Recommend regular full body skin exams Recommend daily broad spectrum sunscreen SPF 30+ to sun-exposed areas, reapply every 2 hours as needed.  Call if any new or changing lesions are noted between office visits   VASCULAR BIRTHMARK Exam: Violaceous patch at right forearm  Benign-appearing.  Observation.  Call clinic for new or changing moles.  Recommend daily use of broad spectrum spf 30+ sunscreen to sun-exposed areas.    INSECT BITE REACTION Exam: Pink edematous papules at right flank  Treatment: Start Clobetasol cream spot treat twice daily until improved dsp 30g 0Rf. Avoid applying to face, groin, and axilla. Use as directed. Long-term use can cause thinning of the skin.  Topical steroids (such as triamcinolone, fluocinolone, fluocinonide, mometasone, clobetasol, halobetasol, betamethasone, hydrocortisone ) can cause thinning and lightening of the skin if they are used for too long in the same area. Your physician has selected the right strength medicine for your problem and area affected on the body. Please  use your medication only as directed by your physician to prevent side effects.     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  Stable Observation.   Call clinic for new or changing lesions.   Not bothersome to patient. She will have dentist address at next visit in 1 month. If bothersome, recommend removal.    Return in about 6 months (around 10/29/2024) for TBSE, Hx melanoma, Hx Dysplastic Nevus.  IBernardine Bridegroom, CMA, am acting as scribe for Artemio Larry, MD .   Documentation: I have reviewed the above documentation for accuracy and completeness, and I agree with the above.  Artemio Larry, MD

## 2024-04-29 NOTE — Patient Instructions (Signed)

## 2024-11-10 ENCOUNTER — Ambulatory Visit: Admitting: Dermatology

## 2024-11-10 DIAGNOSIS — Z86006 Personal history of melanoma in-situ: Secondary | ICD-10-CM | POA: Diagnosis not present

## 2024-11-10 DIAGNOSIS — Z1283 Encounter for screening for malignant neoplasm of skin: Secondary | ICD-10-CM

## 2024-11-10 DIAGNOSIS — D1801 Hemangioma of skin and subcutaneous tissue: Secondary | ICD-10-CM

## 2024-11-10 DIAGNOSIS — D2271 Melanocytic nevi of right lower limb, including hip: Secondary | ICD-10-CM

## 2024-11-10 DIAGNOSIS — W908XXA Exposure to other nonionizing radiation, initial encounter: Secondary | ICD-10-CM | POA: Diagnosis not present

## 2024-11-10 DIAGNOSIS — L700 Acne vulgaris: Secondary | ICD-10-CM

## 2024-11-10 DIAGNOSIS — L821 Other seborrheic keratosis: Secondary | ICD-10-CM | POA: Diagnosis not present

## 2024-11-10 DIAGNOSIS — L814 Other melanin hyperpigmentation: Secondary | ICD-10-CM | POA: Diagnosis not present

## 2024-11-10 DIAGNOSIS — D225 Melanocytic nevi of trunk: Secondary | ICD-10-CM | POA: Diagnosis not present

## 2024-11-10 DIAGNOSIS — L578 Other skin changes due to chronic exposure to nonionizing radiation: Secondary | ICD-10-CM | POA: Diagnosis not present

## 2024-11-10 DIAGNOSIS — D229 Melanocytic nevi, unspecified: Secondary | ICD-10-CM

## 2024-11-10 DIAGNOSIS — Z86018 Personal history of other benign neoplasm: Secondary | ICD-10-CM

## 2024-11-10 MED ORDER — ADAPALENE 0.3 % EX GEL: CUTANEOUS | 5 refills | Status: AC

## 2024-11-10 NOTE — Progress Notes (Signed)
 Follow-Up Visit   Subjective  Jodi Carpenter is a 43 y.o. female who presents for the following: Skin Cancer Screening and Full Body Skin Exam  The patient presents for Total-Body Skin Exam (TBSE) for skin cancer screening and mole check. The patient has spots, moles and lesions to be evaluated, some may be new or changing. History of melanoma in situ, L buttock, 12/2021. History of Dysplastic Nevi. Patient with acne on the chin, would like Rx.     The following portions of the chart were reviewed this encounter and updated as appropriate: medications, allergies, medical history  Review of Systems:  No other skin or systemic complaints except as noted in HPI or Assessment and Plan.  Objective  Well appearing patient in no apparent distress; mood and affect are within normal limits.  A full examination was performed including scalp, head, eyes, ears, nose, lips, neck, chest, axillae, abdomen, back, buttocks, bilateral upper extremities, bilateral lower extremities, hands, feet, fingers, toes, fingernails, and toenails. All findings within normal limits unless otherwise noted below.   Relevant physical exam findings are noted in the Assessment and Plan.    Assessment & Plan   SKIN CANCER SCREENING PERFORMED TODAY.  ACTINIC DAMAGE - Chronic condition, secondary to cumulative UV/sun exposure - diffuse scaly erythematous macules with underlying dyspigmentation - Recommend daily broad spectrum sunscreen SPF 30+ to sun-exposed areas, reapply every 2 hours as needed.  - Staying in the shade or wearing long sleeves, sun glasses (UVA+UVB protection) and wide brim hats (4-inch brim around the entire circumference of the hat) are also recommended for sun protection.  - Call for new or changing lesions.  LENTIGINES, SEBORRHEIC KERATOSES, HEMANGIOMAS - Benign normal skin lesions - Benign-appearing - Call for any changes  MELANOCYTIC NEVI - Tan-brown and/or pink-flesh-colored symmetric  macules and papules - 3 mm thin medium brown papule on right lower heel - 3 mm medium brown macule  Left upper buttock - 3 mm medium brown macule left mid upper abdomen  - 4 mm speckled brown macule left spinal upper back - Benign appearing on exam today - Observation - Call clinic for new or changing moles - Recommend daily use of broad spectrum spf 30+ sunscreen to sun-exposed areas.   HISTORY OF MELANOMA IN SITU Left buttock   excised 01/09/22 - No evidence of recurrence today - Recommend regular full body skin exams - Recommend daily broad spectrum sunscreen SPF 30+ to sun-exposed areas, reapply every 2 hours as needed.  - Call if any new or changing lesions are noted between office visits   HISTORY OF DYSPLASTIC NEVUS Multiple locations see history  No evidence of recurrence today Recommend regular full body skin exams Recommend daily broad spectrum sunscreen SPF 30+ to sun-exposed areas, reapply every 2 hours as needed.  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 Condition is symptomatic/ bothersome to patient. Not currently at goal.   Treatment Plan: Start adapalene  0.3% gel nightly as tolerated dsp 45g 5Rf. Topical retinoid medications like tretinoin/Retin-A, adapalene /Differin , tazarotene/Fabior, and Epiduo/Epiduo Forte can cause dryness and irritation when first started. Only apply a pea-sized amount to the entire affected area. Avoid applying it around the eyes, edges of mouth and creases at the nose. If you experience irritation, use a good moisturizer first and/or apply the medicine less often. If you are doing  well with the medicine, you can increase how often you use it until you are applying every night. Be careful with sun protection while using this medication as it can make you sensitive to the sun. This  medicine should not be used by pregnant women.    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  Observation.  Call clinic for new or changing moles.  Recommend daily use of broad spectrum spf 30+ sunscreen to sun-exposed areas.    Return in about 6 months (around 05/11/2025) for TBSE, Hx melanoma IS, Hx Dysplastic Nevus.  IAndrea Kerns, CMA, am acting as scribe for Rexene Rattler, MD .   Documentation: I have reviewed the above documentation for accuracy and completeness, and I agree with the above.  Rexene Rattler, MD

## 2024-11-10 NOTE — Patient Instructions (Signed)

## 2024-11-14 ENCOUNTER — Ambulatory Visit
Admission: RE | Admit: 2024-11-14 | Discharge: 2024-11-14 | Disposition: A | Discharge status: Home / Self Care | Source: Ambulatory Visit | Attending: Nurse Practitioner | Admitting: Nurse Practitioner

## 2024-11-14 DIAGNOSIS — Z1231 Encounter for screening mammogram for malignant neoplasm of breast: Secondary | ICD-10-CM | POA: Insufficient documentation

## 2025-05-11 ENCOUNTER — Ambulatory Visit: Admitting: Dermatology
